# Patient Record
Sex: Female | Born: 1960 | Race: Black or African American | Hispanic: No | Marital: Married | Smoking: Never smoker
Health system: Southern US, Community
[De-identification: ages and names within clinical notes are randomized; demographics above are authoritative.]

## PROBLEM LIST (undated history)

## (undated) DIAGNOSIS — Q892 Congenital malformations of other endocrine glands: Secondary | ICD-10-CM

## (undated) DIAGNOSIS — I10 Essential (primary) hypertension: Secondary | ICD-10-CM

## (undated) DIAGNOSIS — G9050 Complex regional pain syndrome I, unspecified: Secondary | ICD-10-CM

## (undated) DIAGNOSIS — M1990 Unspecified osteoarthritis, unspecified site: Secondary | ICD-10-CM

## (undated) DIAGNOSIS — E7800 Pure hypercholesterolemia, unspecified: Secondary | ICD-10-CM

## (undated) DIAGNOSIS — J708 Respiratory conditions due to other specified external agents: Secondary | ICD-10-CM

## (undated) DIAGNOSIS — N189 Chronic kidney disease, unspecified: Secondary | ICD-10-CM

## (undated) DIAGNOSIS — K219 Gastro-esophageal reflux disease without esophagitis: Secondary | ICD-10-CM

## (undated) HISTORY — PX: KIDNEY STONE SURGERY: SHX686

## (undated) HISTORY — PX: KNEE ARTHROSCOPY: SUR90

## (undated) HISTORY — PX: COMBINED HYSTEROSCOPY DIAGNOSTIC / D&C: SUR297

## (undated) HISTORY — PX: WRIST ARTHROSCOPY W/ TRIANGULAR FIBROCARTILAGE REPAIR: SHX2670

## (undated) HISTORY — PX: DIAGNOSTIC LAPAROSCOPY: SUR761

## (undated) HISTORY — PX: ABDOMINAL HYSTERECTOMY: SHX81

---

## 1997-11-18 ENCOUNTER — Observation Stay (HOSPITAL_COMMUNITY): Admission: AD | Admit: 1997-11-18 | Discharge: 1997-11-19 | Payer: Self-pay | Admitting: Obstetrics and Gynecology

## 1997-11-28 ENCOUNTER — Inpatient Hospital Stay (HOSPITAL_COMMUNITY): Admission: AD | Admit: 1997-11-28 | Discharge: 1997-11-30 | Payer: Self-pay | Admitting: Obstetrics and Gynecology

## 1997-12-04 ENCOUNTER — Inpatient Hospital Stay (HOSPITAL_COMMUNITY): Admission: AD | Admit: 1997-12-04 | Discharge: 1997-12-04 | Payer: Self-pay | Admitting: Obstetrics and Gynecology

## 1997-12-05 ENCOUNTER — Encounter (HOSPITAL_COMMUNITY): Admission: RE | Admit: 1997-12-05 | Discharge: 1998-03-02 | Payer: Self-pay | Admitting: Obstetrics and Gynecology

## 1997-12-18 ENCOUNTER — Inpatient Hospital Stay (HOSPITAL_COMMUNITY): Admission: AD | Admit: 1997-12-18 | Discharge: 1997-12-18 | Payer: Self-pay | Admitting: Obstetrics and Gynecology

## 1998-01-12 ENCOUNTER — Inpatient Hospital Stay (HOSPITAL_COMMUNITY): Admission: AD | Admit: 1998-01-12 | Discharge: 1998-01-12 | Payer: Self-pay | Admitting: Gynecology

## 1998-01-20 ENCOUNTER — Inpatient Hospital Stay (HOSPITAL_COMMUNITY): Admission: AD | Admit: 1998-01-20 | Discharge: 1998-01-20 | Payer: Self-pay | Admitting: Obstetrics and Gynecology

## 1998-03-01 ENCOUNTER — Inpatient Hospital Stay (HOSPITAL_COMMUNITY): Admission: AD | Admit: 1998-03-01 | Discharge: 1998-03-04 | Payer: Self-pay | Admitting: Gynecology

## 1998-03-07 ENCOUNTER — Encounter (HOSPITAL_COMMUNITY): Admission: RE | Admit: 1998-03-07 | Discharge: 1998-06-05 | Payer: Self-pay | Admitting: Obstetrics and Gynecology

## 1999-04-18 ENCOUNTER — Other Ambulatory Visit: Admission: RE | Admit: 1999-04-18 | Discharge: 1999-04-18 | Payer: Self-pay | Admitting: Obstetrics and Gynecology

## 1999-11-06 ENCOUNTER — Ambulatory Visit (HOSPITAL_COMMUNITY): Admission: RE | Admit: 1999-11-06 | Discharge: 1999-11-06 | Payer: Self-pay | Admitting: Obstetrics and Gynecology

## 1999-11-06 ENCOUNTER — Encounter: Payer: Self-pay | Admitting: Obstetrics and Gynecology

## 2001-02-16 ENCOUNTER — Other Ambulatory Visit: Admission: RE | Admit: 2001-02-16 | Discharge: 2001-02-16 | Payer: Self-pay | Admitting: Obstetrics and Gynecology

## 2002-01-05 ENCOUNTER — Encounter: Payer: Self-pay | Admitting: Obstetrics and Gynecology

## 2002-01-05 ENCOUNTER — Ambulatory Visit (HOSPITAL_COMMUNITY): Admission: RE | Admit: 2002-01-05 | Discharge: 2002-01-05 | Payer: Self-pay | Admitting: Obstetrics and Gynecology

## 2002-04-01 ENCOUNTER — Other Ambulatory Visit: Admission: RE | Admit: 2002-04-01 | Discharge: 2002-04-01 | Payer: Self-pay | Admitting: Obstetrics and Gynecology

## 2002-09-22 ENCOUNTER — Ambulatory Visit (HOSPITAL_COMMUNITY): Admission: RE | Admit: 2002-09-22 | Discharge: 2002-09-22 | Payer: Self-pay | Admitting: Interventional Cardiology

## 2004-01-12 ENCOUNTER — Emergency Department (HOSPITAL_COMMUNITY): Admission: EM | Admit: 2004-01-12 | Discharge: 2004-01-12 | Payer: Self-pay | Admitting: Emergency Medicine

## 2004-05-04 ENCOUNTER — Ambulatory Visit (HOSPITAL_COMMUNITY): Admission: RE | Admit: 2004-05-04 | Discharge: 2004-05-04 | Payer: Self-pay | Admitting: Internal Medicine

## 2005-08-13 ENCOUNTER — Ambulatory Visit (HOSPITAL_COMMUNITY): Admission: RE | Admit: 2005-08-13 | Discharge: 2005-08-13 | Payer: Self-pay | Admitting: Internal Medicine

## 2005-10-14 ENCOUNTER — Emergency Department (HOSPITAL_COMMUNITY): Admission: EM | Admit: 2005-10-14 | Discharge: 2005-10-15 | Payer: Self-pay | Admitting: Emergency Medicine

## 2005-11-25 ENCOUNTER — Ambulatory Visit (HOSPITAL_COMMUNITY): Admission: RE | Admit: 2005-11-25 | Discharge: 2005-11-25 | Payer: Self-pay | Admitting: Obstetrics and Gynecology

## 2005-12-19 ENCOUNTER — Encounter: Admission: RE | Admit: 2005-12-19 | Discharge: 2005-12-19 | Payer: Self-pay | Admitting: Obstetrics and Gynecology

## 2006-02-06 ENCOUNTER — Ambulatory Visit (HOSPITAL_COMMUNITY): Admission: RE | Admit: 2006-02-06 | Discharge: 2006-02-06 | Payer: Self-pay | Admitting: Obstetrics and Gynecology

## 2006-02-06 ENCOUNTER — Encounter (INDEPENDENT_AMBULATORY_CARE_PROVIDER_SITE_OTHER): Payer: Self-pay | Admitting: *Deleted

## 2006-05-06 ENCOUNTER — Encounter: Admission: RE | Admit: 2006-05-06 | Discharge: 2006-05-06 | Payer: Self-pay | Admitting: Internal Medicine

## 2006-05-28 ENCOUNTER — Inpatient Hospital Stay (HOSPITAL_COMMUNITY): Admission: AD | Admit: 2006-05-28 | Discharge: 2006-06-03 | Payer: Self-pay

## 2006-08-18 ENCOUNTER — Ambulatory Visit (HOSPITAL_COMMUNITY): Admission: RE | Admit: 2006-08-18 | Discharge: 2006-08-19 | Payer: Self-pay | Admitting: Obstetrics and Gynecology

## 2006-08-18 ENCOUNTER — Encounter (INDEPENDENT_AMBULATORY_CARE_PROVIDER_SITE_OTHER): Payer: Self-pay | Admitting: Obstetrics and Gynecology

## 2006-08-31 ENCOUNTER — Inpatient Hospital Stay (HOSPITAL_COMMUNITY): Admission: AD | Admit: 2006-08-31 | Discharge: 2006-08-31 | Payer: Self-pay | Admitting: Obstetrics and Gynecology

## 2006-09-02 ENCOUNTER — Ambulatory Visit: Payer: Self-pay | Admitting: Internal Medicine

## 2006-09-10 ENCOUNTER — Ambulatory Visit (HOSPITAL_COMMUNITY): Admission: RE | Admit: 2006-09-10 | Discharge: 2006-09-10 | Payer: Self-pay | Admitting: Internal Medicine

## 2007-02-13 ENCOUNTER — Ambulatory Visit (HOSPITAL_COMMUNITY): Admission: RE | Admit: 2007-02-13 | Discharge: 2007-02-13 | Payer: Self-pay | Admitting: Internal Medicine

## 2007-04-07 ENCOUNTER — Encounter: Admission: RE | Admit: 2007-04-07 | Discharge: 2007-04-07 | Payer: Self-pay | Admitting: Internal Medicine

## 2007-05-04 ENCOUNTER — Encounter: Admission: RE | Admit: 2007-05-04 | Discharge: 2007-05-04 | Payer: Self-pay | Admitting: Allergy and Immunology

## 2007-05-08 ENCOUNTER — Encounter: Admission: RE | Admit: 2007-05-08 | Discharge: 2007-05-08 | Payer: Self-pay | Admitting: Allergy and Immunology

## 2007-11-30 ENCOUNTER — Encounter: Admission: RE | Admit: 2007-11-30 | Discharge: 2007-11-30 | Payer: Self-pay | Admitting: Internal Medicine

## 2008-03-14 IMAGING — CT CT HEAD W/O CM
1 series · 16 of 26 positions shown, 20 images · IV contrast (agent unspecified)
Comparison: none

CLINICAL DATA: Syncope, headache.  
 HEAD CT WITHOUT CONTRAST:
TECHNIQUE: Contiguous axial images were obtained from the base of the skull through the vertex according to standard protocol without contrast.

[Series 2: brain · axial · 0.47mm/px · z∈[+154,+270]mm · 16 of 26 slices shown, 20 images]
[im 2/26  brain]
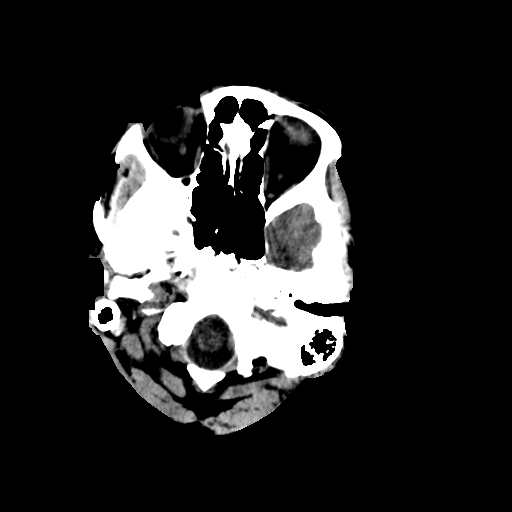
[im 2/26  bone]
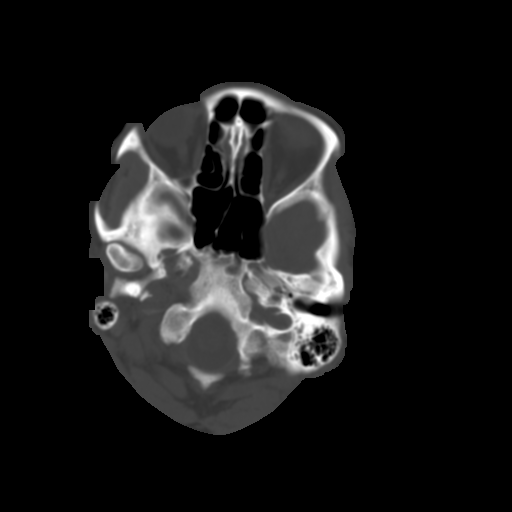
[im 4/26  brain]
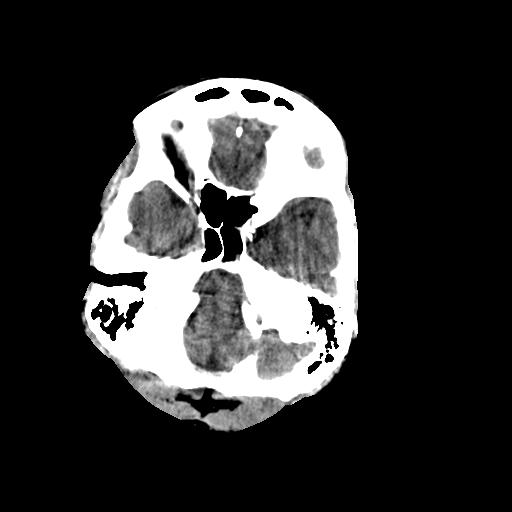
[im 5/26  brain]
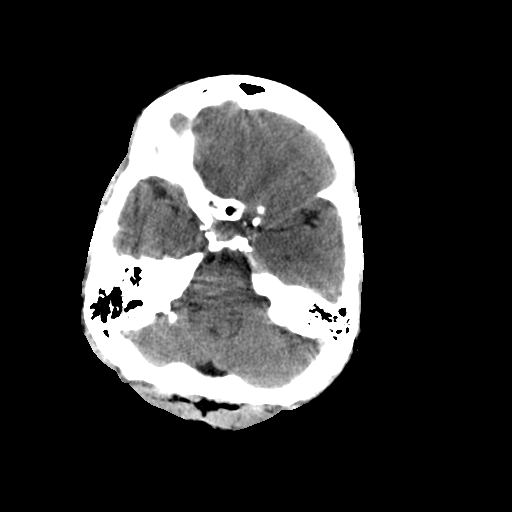
[im 7/26  brain]
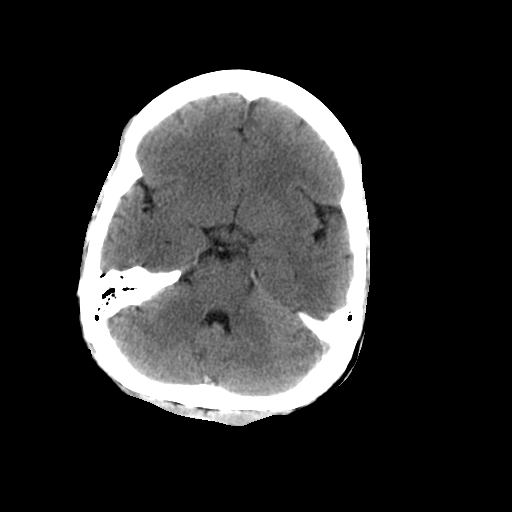
[im 8/26  brain]
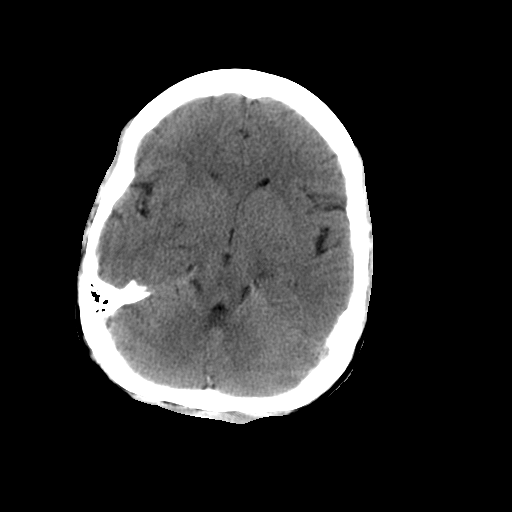
[im 8/26  bone]
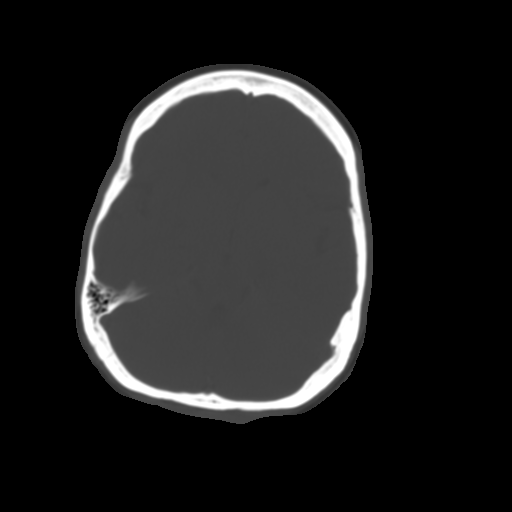
[im 10/26  brain]
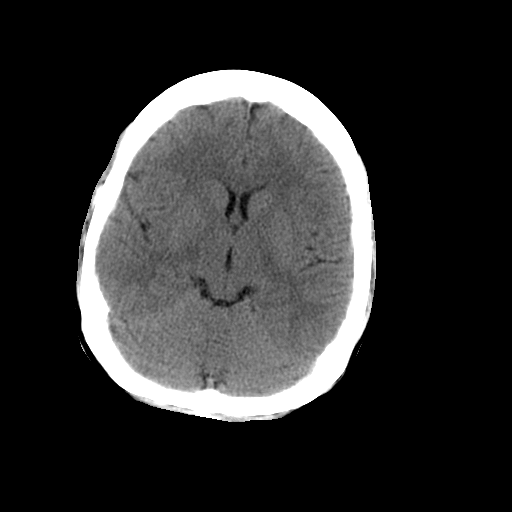
[im 11/26  brain]
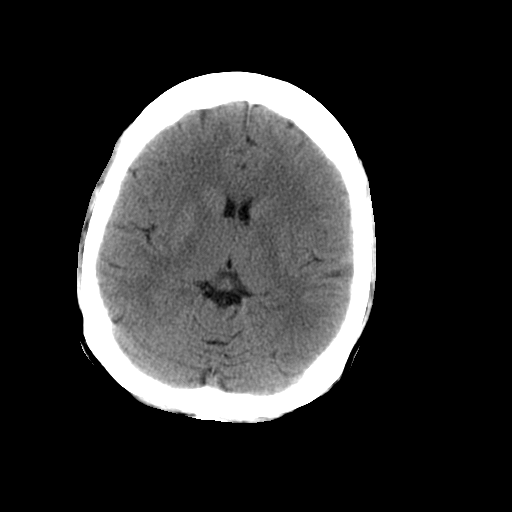
[im 13/26  brain]
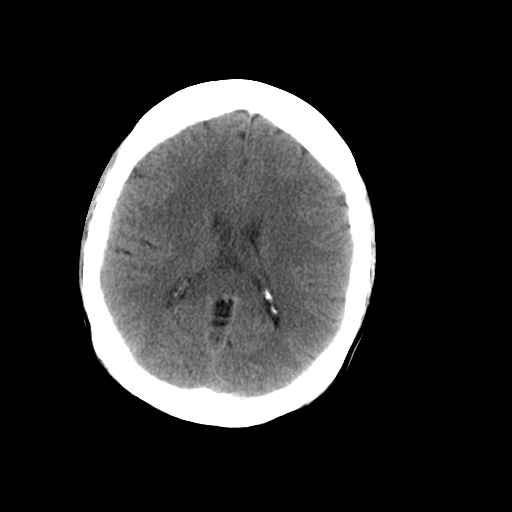
[im 14/26  brain]
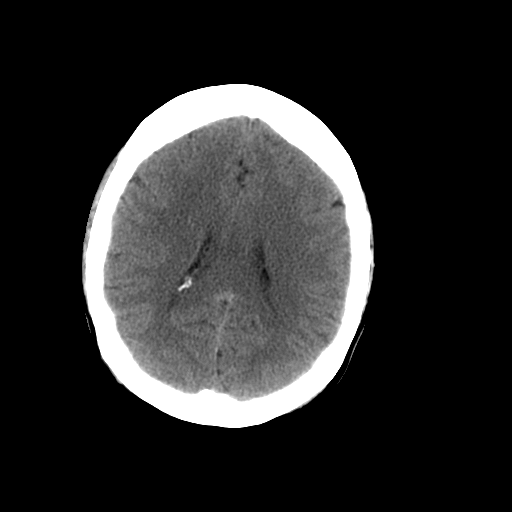
[im 14/26  bone]
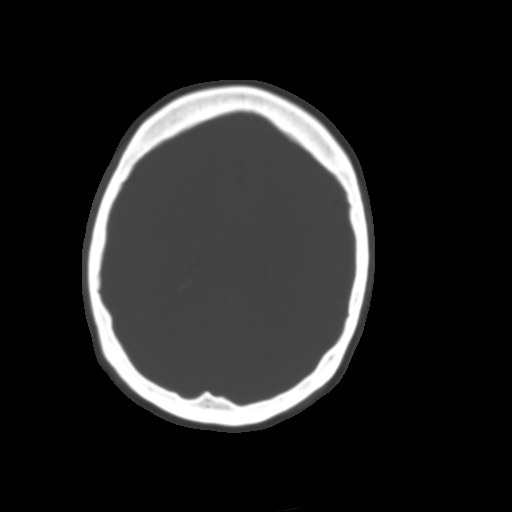
[im 16/26  brain]
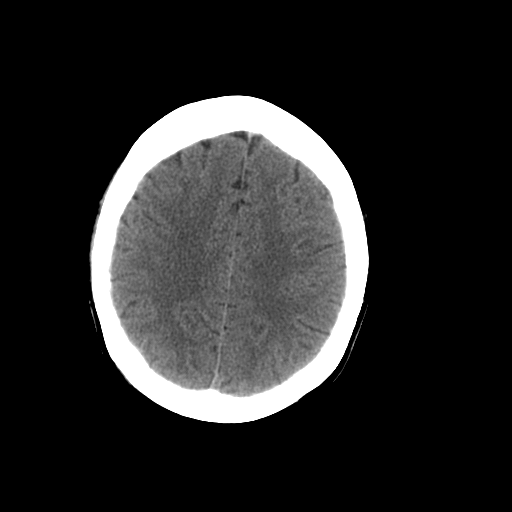
[im 17/26  brain]
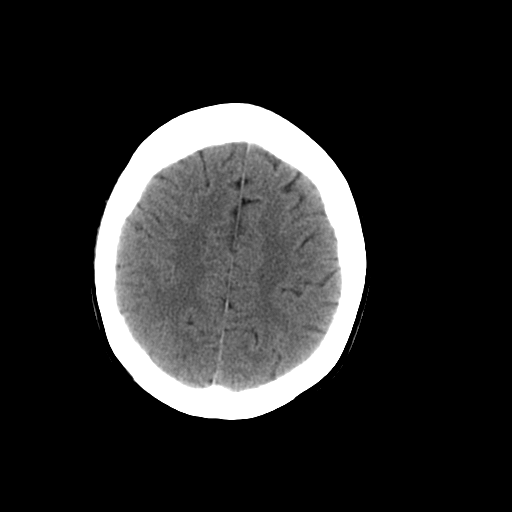
[im 19/26  brain]
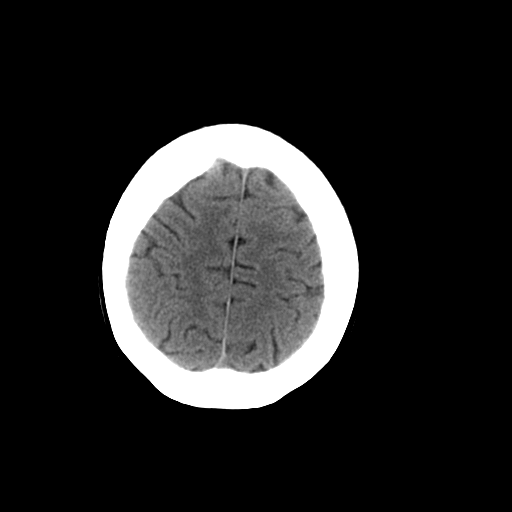
[im 20/26  brain]
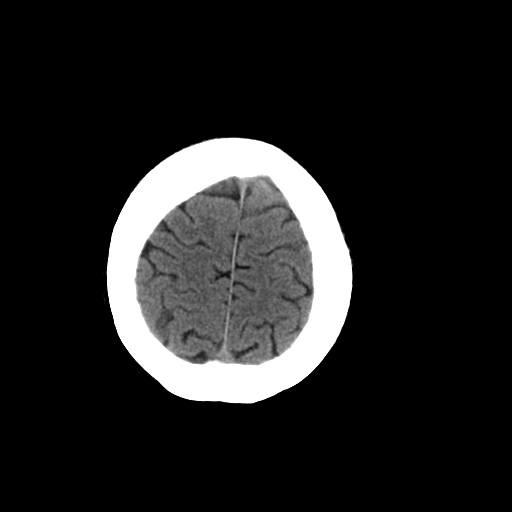
[im 20/26  bone]
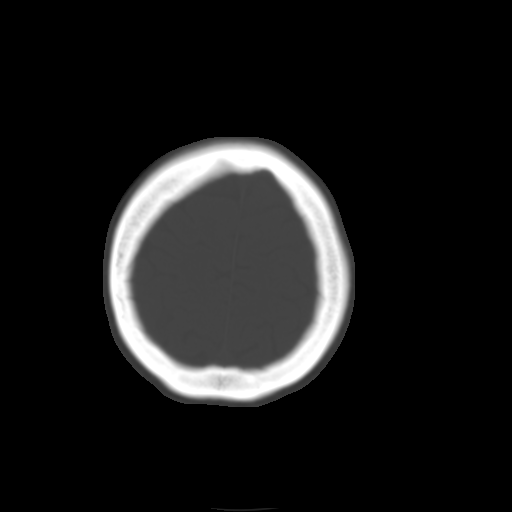
[im 22/26  brain]
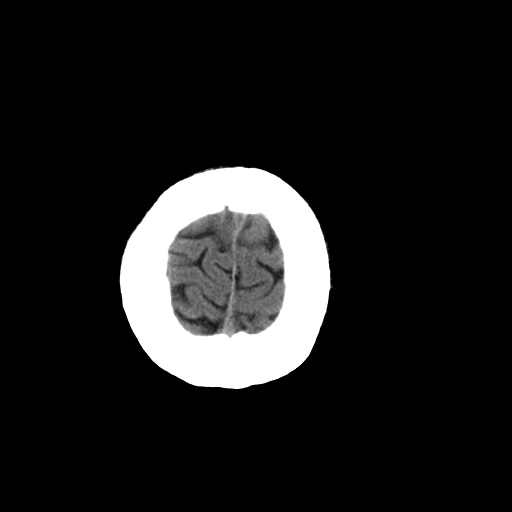
[im 23/26  brain]
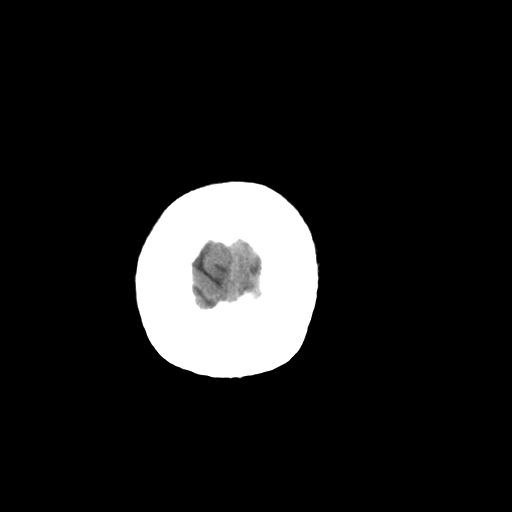
[im 25/26  brain]
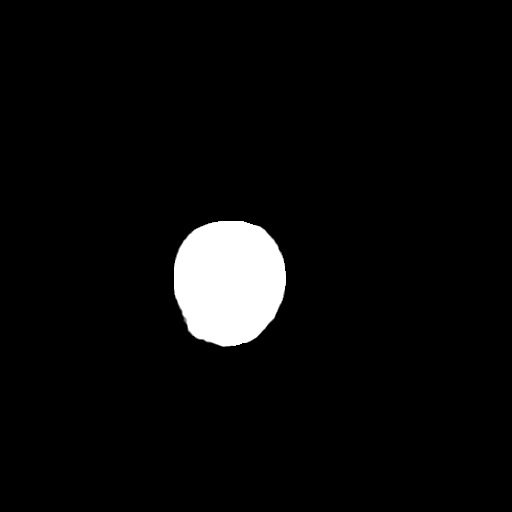

[16 of 26 positions shown; findings below may reference images not displayed]

FINDINGS: There is no evidence of intracranial hemorrhage, brain edema, acute infarct, mass lesion, or mass effect.  No other intra-axial abnormalities are seen, and the ventricles are within normal limits.  No abnormal extra-axial fluid collections or masses are identified.  No skull abnormalities are noted.
IMPRESSION: Negative non-contrast head CT.

## 2008-04-25 IMAGING — US US PELVIS COMPLETE MODIFY
1 series · 14 of 25 positions shown · non-contrast
Comparison: none

CLINICAL DATA: Pelvic pain; evaluate for fibroids.
 TRANSABDOMINAL AND TRANSVAGINAL PELVIC ULTRASOUND:
TECHNIQUE: Both transabdominal and transvaginal ultrasound examinations of the pelvis were performed including evaluation of the uterus, ovaries, adnexal regions, and pelvic cul-de-sac.

[Series 1: us pelvis complete modify · 0.41mm/px · 14 of 57 slices shown]
[im 1/57]
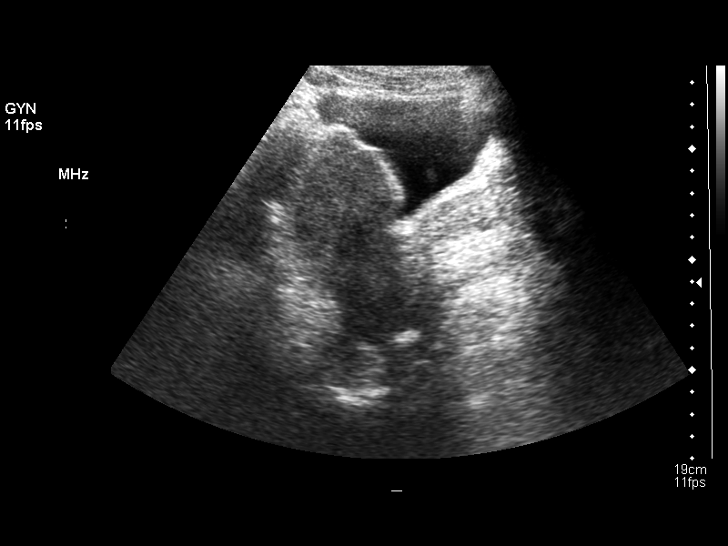
[im 5/57]
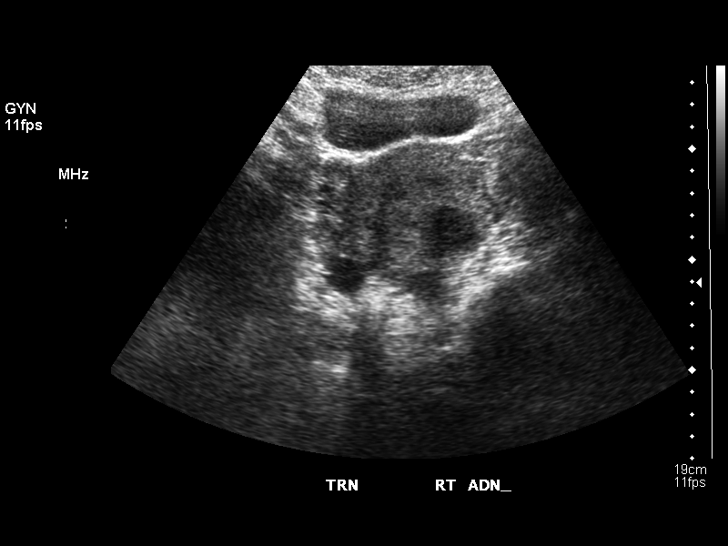
[im 10/57]
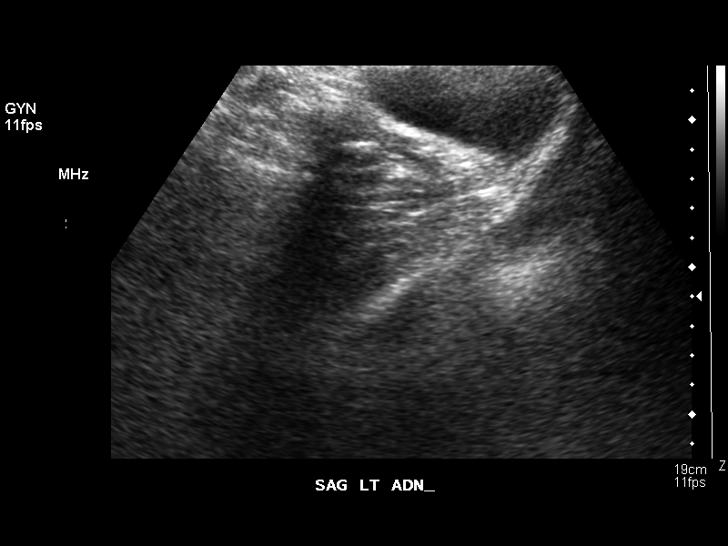
[im 15/57]
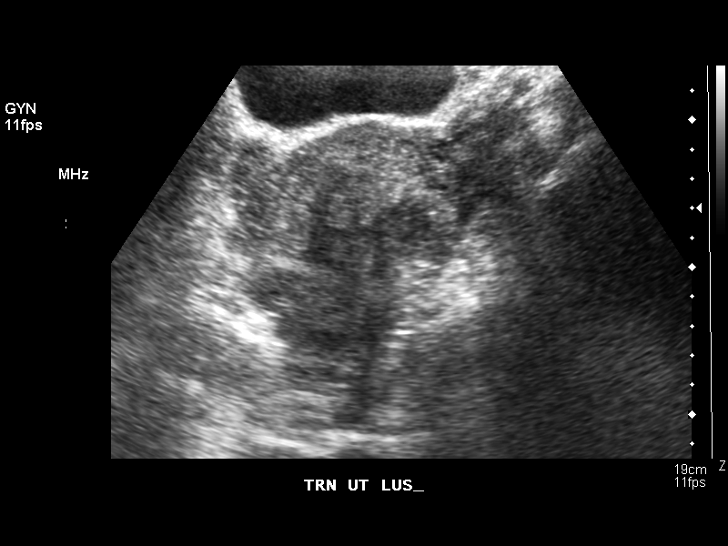
[im 19/57]
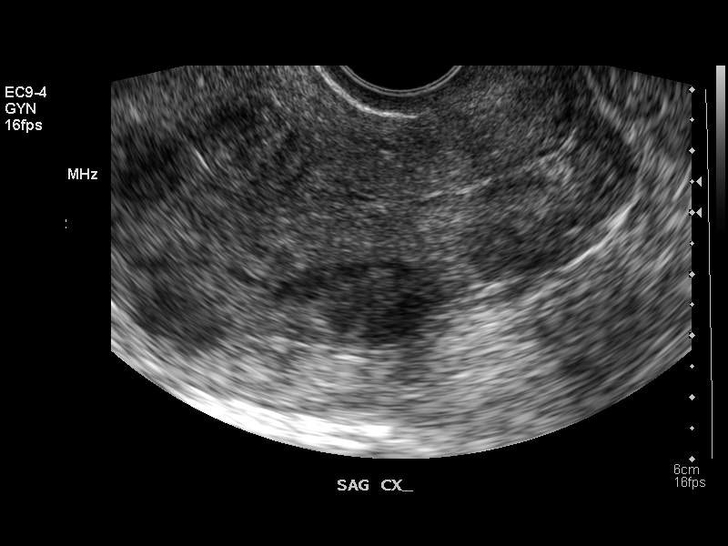
[im 22/57]
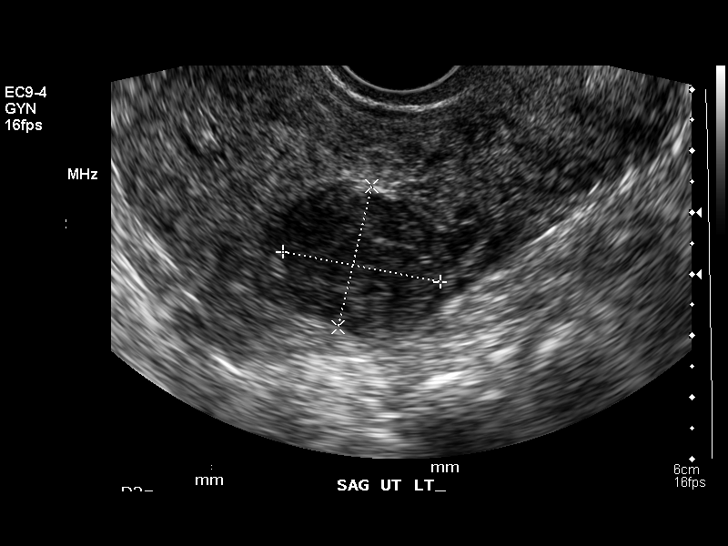
[im 26/57]
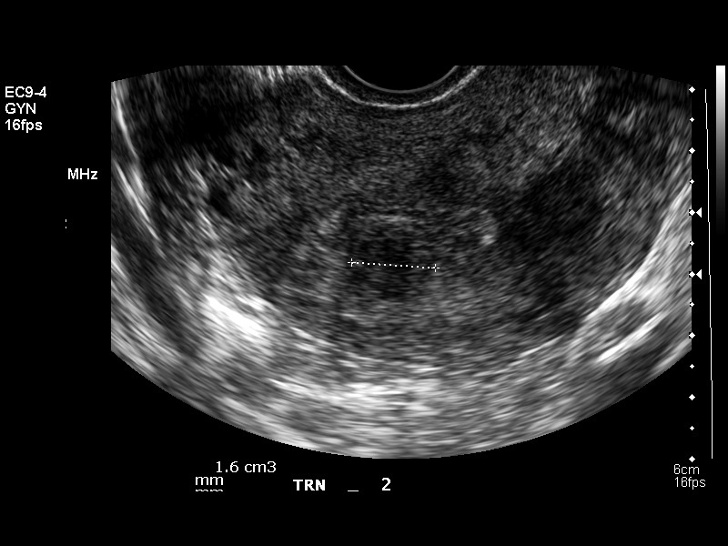
[im 31/57]
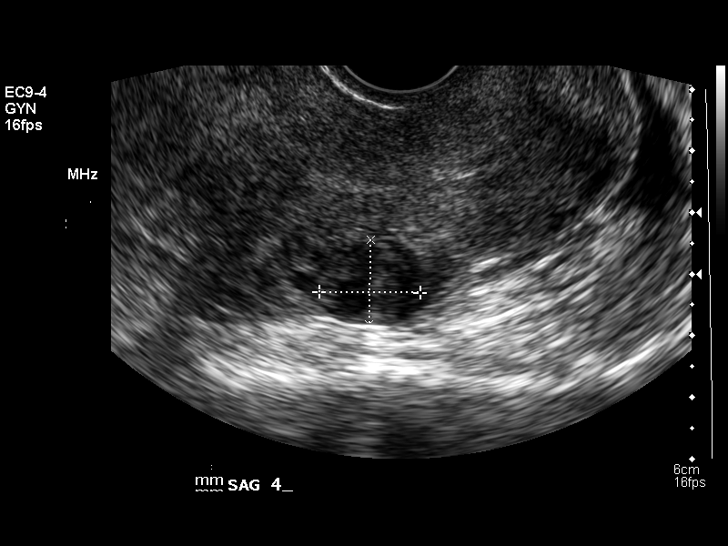
[im 36/57]
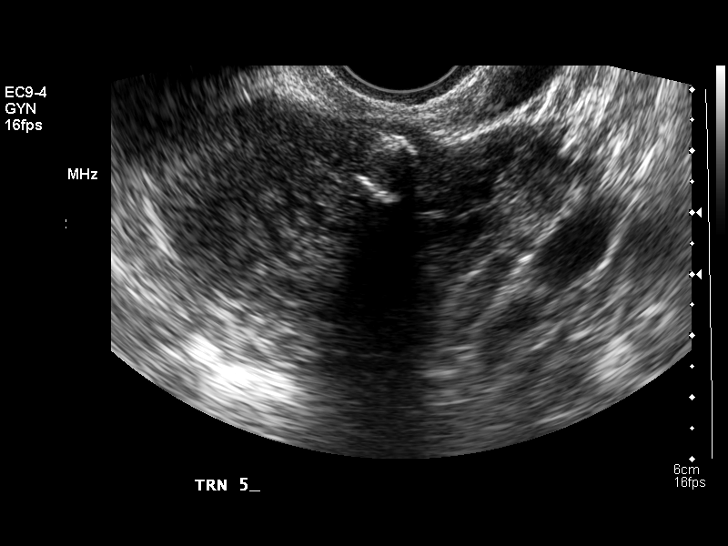
[im 38/57]
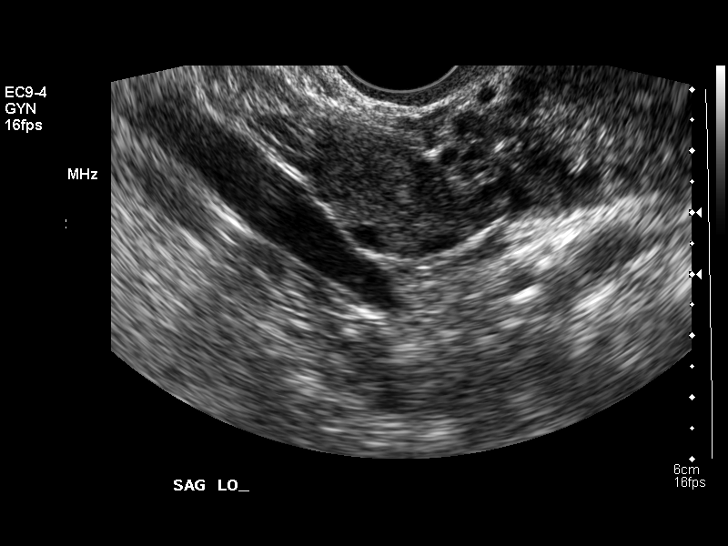
[im 43/57]
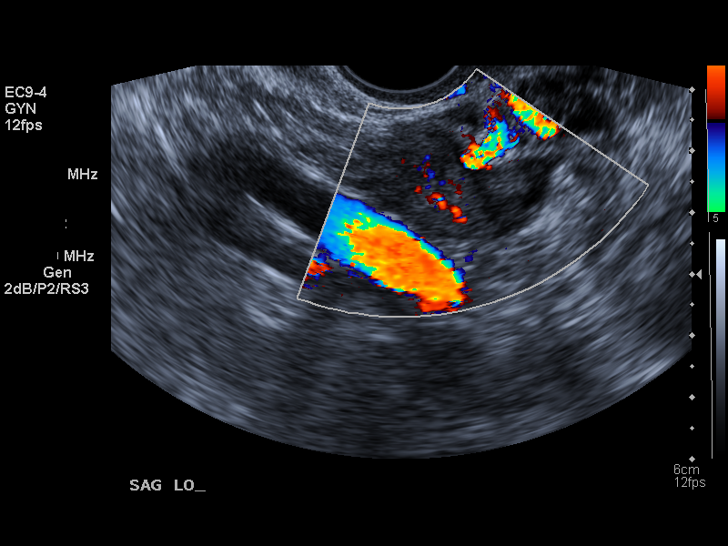
[im 47/57]
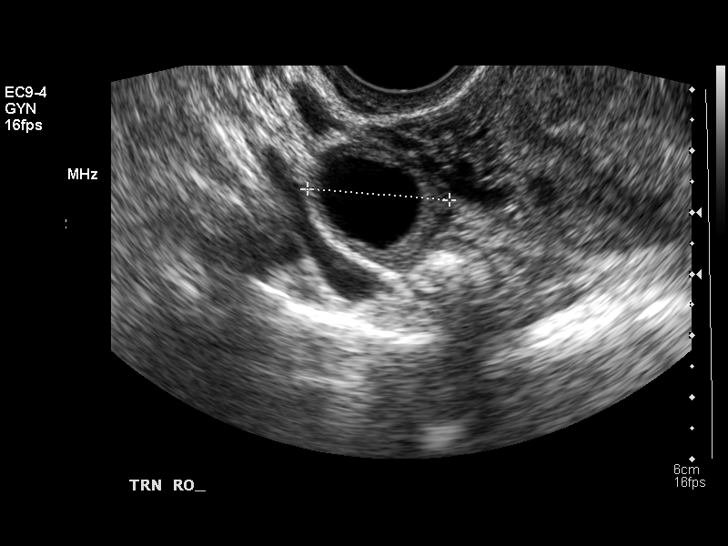
[im 52/57]
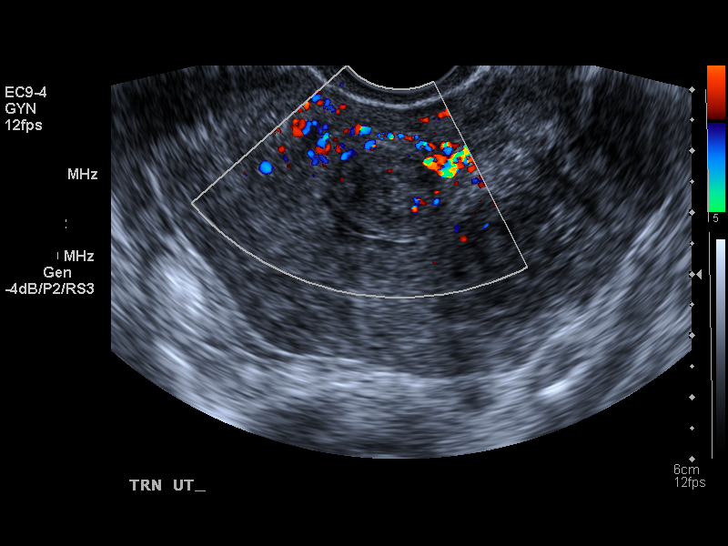
[im 57/57]
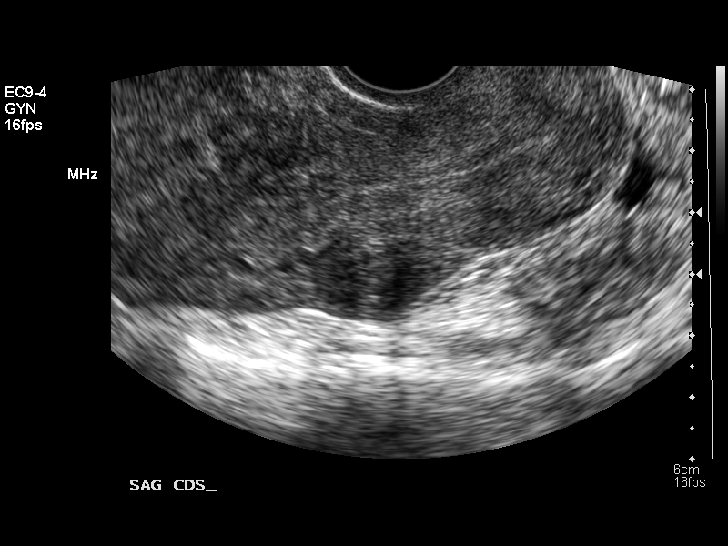

[14 of 25 positions shown; findings below may reference images not displayed]

FINDINGS: The uterus is slightly enlarged, but lobular. The uterine dimensions are 9.7 x 4.7 x 6.8 cm.  Numerous fibroids are present.  The largest are located in the posterior myometrium at the mid-segment measuring 2.6 cm and to the left of midline at the mid-segment measuring 2.2 cm.  The endometrium is fairly indistinct which may represent an element of adenomyosis.  The endometrial stripe is not thickened, however, measuring 7 mm in width.  
 Both ovaries have a normal size, shape and appearance.  The right ovary measures 3.7 x 1.6 x 2.3 cm, and the left ovary measures 3.0 x 1.6 x 1.4 cm.  Trace free pelvic fluid is seen.
IMPRESSION: Fibroid uterus.  The overall heterogeneity of the myometrial echotexture and indistinctness of the junctional zone also raises the question of adenomyosis.

## 2008-05-16 ENCOUNTER — Encounter: Admission: RE | Admit: 2008-05-16 | Discharge: 2008-05-16 | Payer: Self-pay | Admitting: Internal Medicine

## 2008-12-02 ENCOUNTER — Encounter: Admission: RE | Admit: 2008-12-02 | Discharge: 2008-12-02 | Payer: Self-pay | Admitting: Internal Medicine

## 2009-01-05 ENCOUNTER — Encounter: Admission: RE | Admit: 2009-01-05 | Discharge: 2009-01-05 | Payer: Self-pay | Admitting: Internal Medicine

## 2009-06-20 ENCOUNTER — Emergency Department (HOSPITAL_COMMUNITY): Admission: EM | Admit: 2009-06-20 | Discharge: 2009-06-20 | Payer: Self-pay | Admitting: Emergency Medicine

## 2009-10-06 IMAGING — CT CT PARANASAL SINUSES LIMITED
1 series · 16 of 20 positions shown, 20 images · IV contrast (agent unspecified)
Comparison: none

CLINICAL DATA: Shortness of breath, sinusitis, cough. 
 CT SINUS LIMITED WITHOUT CONTRAST:
TECHNIQUE: Limited coronal CT images were obtained through the paranasal sinuses without intravenous contrast.

[Series 2: limited sinus prone · axial · 0.33mm/px · z∈[+54,+122]mm · 16 of 20 slices shown, 20 images]
[im 2/20  brain]
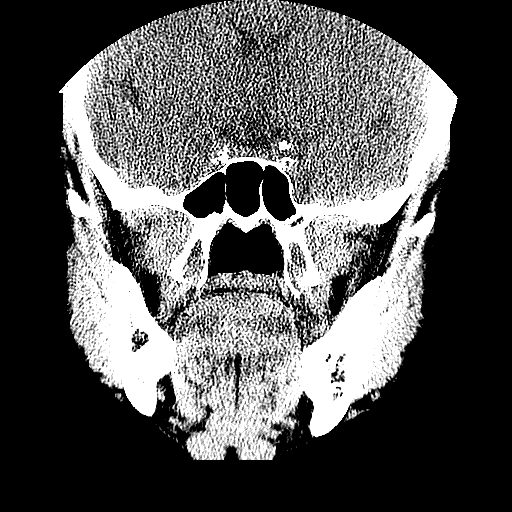
[im 2/20  bone]
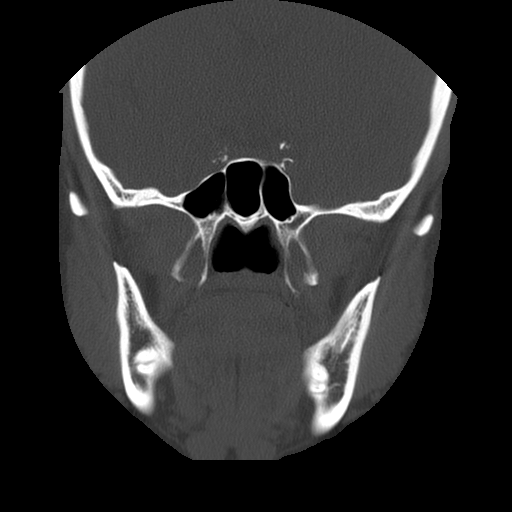
[im 3/20  bone]
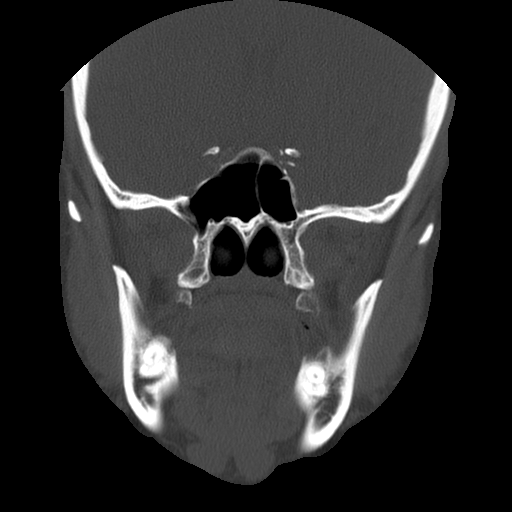
[im 4/20  bone]
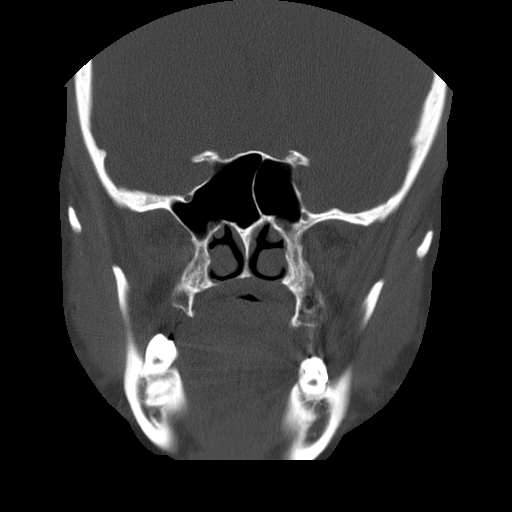
[im 5/20  bone]
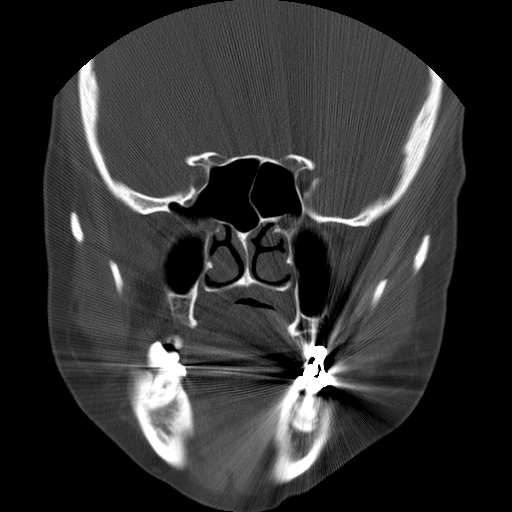
[im 7/20  brain]
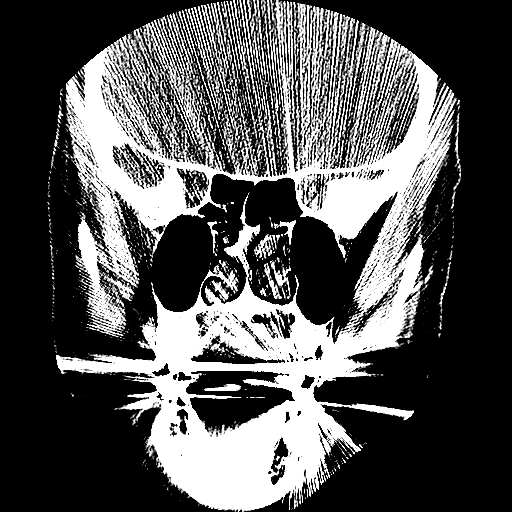
[im 7/20  bone]
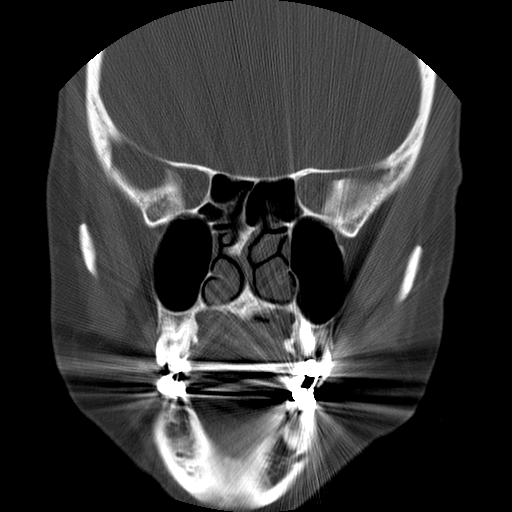
[im 8/20  bone]
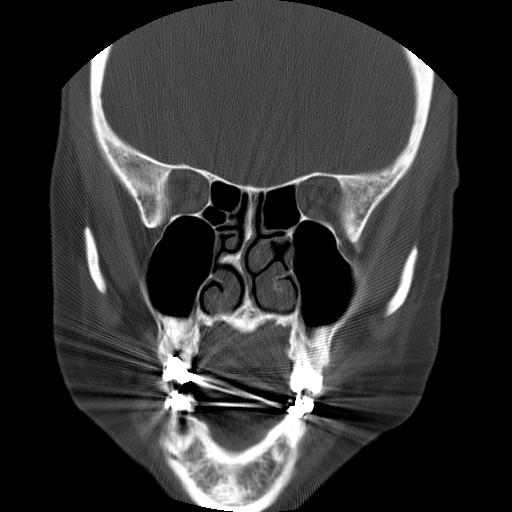
[im 9/20  bone]
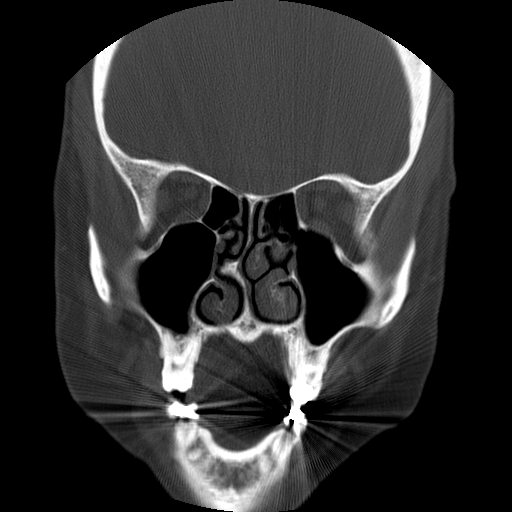
[im 10/20  bone]
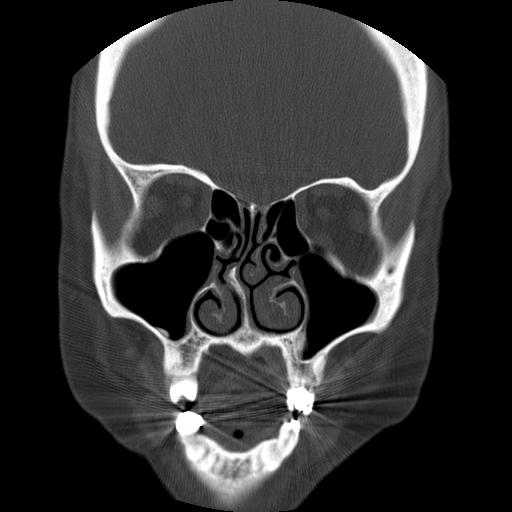
[im 11/20  brain]
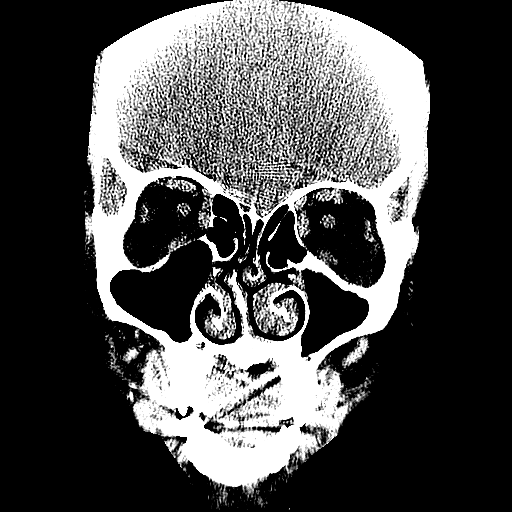
[im 11/20  bone]
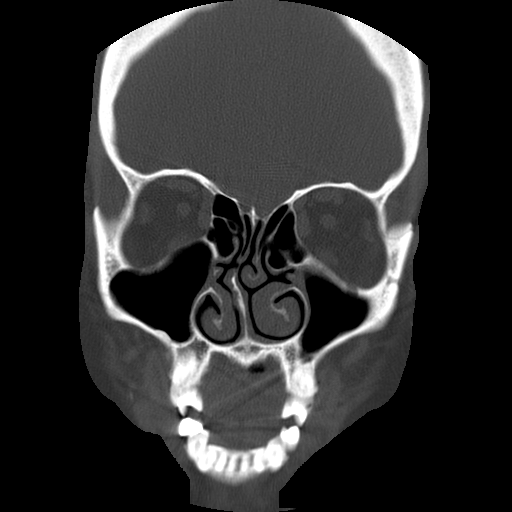
[im 12/20  bone]
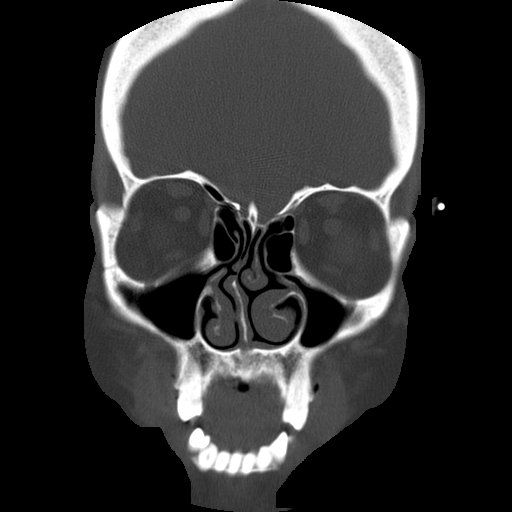
[im 13/20  bone]
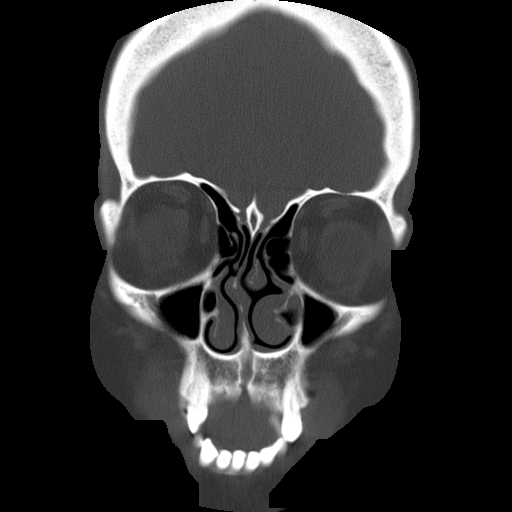
[im 14/20  bone]
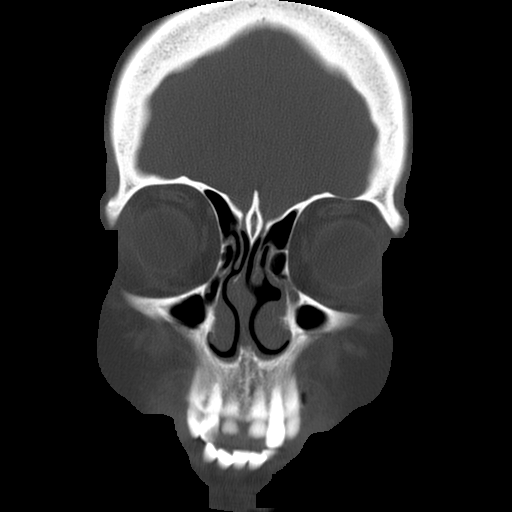
[im 16/20  brain]
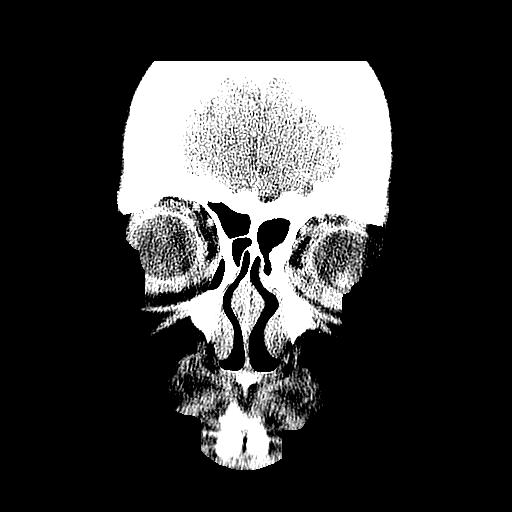
[im 16/20  bone]
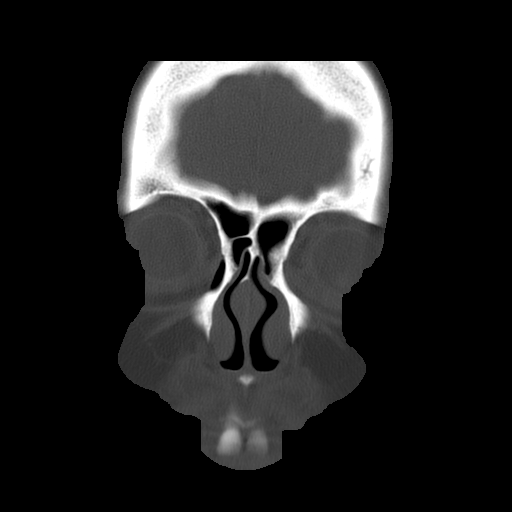
[im 17/20  bone]
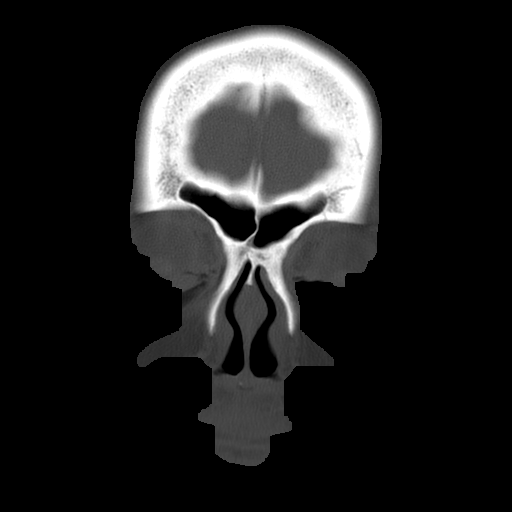
[im 18/20  bone]
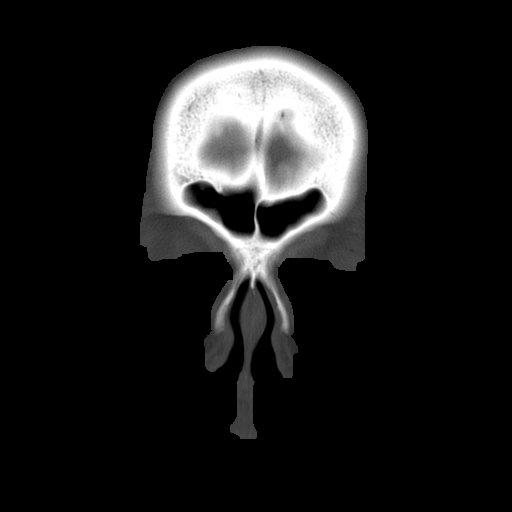
[im 19/20  bone]
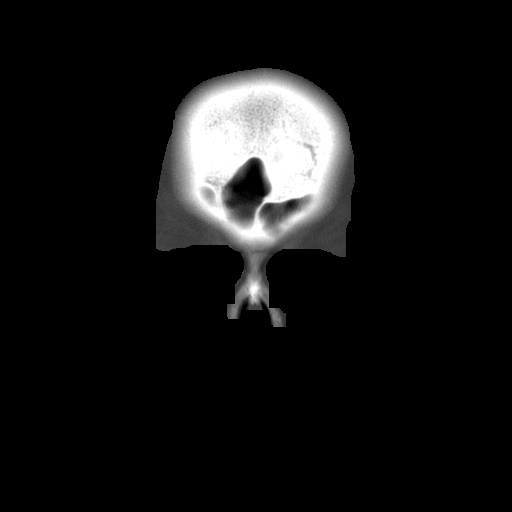

[16 of 20 positions shown; findings below may reference images not displayed]

FINDINGS: The frontal, ethmoid, maxillary, and sphenoid sinuses are all clear.  There is mild nasal septal deviation towards the left.
IMPRESSION: Normal examination.

## 2010-05-14 ENCOUNTER — Other Ambulatory Visit (HOSPITAL_COMMUNITY): Payer: Self-pay | Admitting: Internal Medicine

## 2010-05-14 DIAGNOSIS — Z1231 Encounter for screening mammogram for malignant neoplasm of breast: Secondary | ICD-10-CM

## 2010-05-22 ENCOUNTER — Ambulatory Visit (HOSPITAL_COMMUNITY): Payer: BC Managed Care – PPO

## 2010-05-23 ENCOUNTER — Ambulatory Visit (HOSPITAL_COMMUNITY)
Admission: RE | Admit: 2010-05-23 | Discharge: 2010-05-23 | Disposition: A | Discharge status: Home / Self Care | Payer: BC Managed Care – PPO | Source: Ambulatory Visit | Attending: Internal Medicine | Admitting: Internal Medicine

## 2010-05-23 DIAGNOSIS — Z1231 Encounter for screening mammogram for malignant neoplasm of breast: Secondary | ICD-10-CM | POA: Insufficient documentation

## 2010-07-10 NOTE — Assessment & Plan Note (Signed)
[facility]                             PULMONARY OFFICE NOTE   [name], [name]                       MRN:          [id number]  DATE:09/02/2006                            DOB:          03/03/1960    REFERRING PHYSICIAN:  Merlene Laughter. Renae Gloss, M.D.   PROBLEM:  A 50 year old woman seen at the kind request of Dr. Renae Gloss in  pulmonary consultation for a second opinion about her allergy and asthma  status.   HISTORY:  She had had a bronchitis type illness a year ago, while in  [place], and has a past history of seasonal rhinitis.  She was skin  tested on evaluation by Dr. Barnetta Chapel, who told her she had asthma, and  sent back to Dr. Renae Gloss for followup.  There was concern that her  bronchitis episode resulted from exposure to moldy environment at a  motel, where she stayed in [place].  She had had a similar problem at  another motel in [place].  After these, she was well until February of  2008, when she began coughing and felt short of breath.  She was seen at  her primary office, diagnosed with a viral bronchitis.  When symptoms  persisted for several weeks, she was treated with antibiotics and  prednisone.  Advair was tried in March.  When that was not helpful, she  was hospitalized for six days and then sent home with a home nebulizer  machine.  She had felt well until around Mothers Day, when she was again  diagnosed with bronchitis in May of 2008.  Metered inhalers and  breathing treatments do provide symptomatic relief.  She considers the  main trigger to be mold, but has not noticed a specific pattern  otherwise.  She considers the classroom and school where she had been  working to have considerable mold issues.   MEDICATIONS:  1. Atenolol 50 mg.  2. Triamterene/HCTZ 37.5/25.  3. Clarinex 5 mg.  4. Singulair 10 mg.  5. Nasonex.  6. Astelin.  7. Advair 250/50.  8. Tricor 145 mg.  9. Vytorin 10/80.  10.Albuterol metered inhaler.  11.Home nebulizer with albuterol.  12.Tessalon Perles.  13.Motrin.  14.Darvocet.   DRUG INTOLERANCE:  MACROBID, TYLOX and VICODIN.  Allergy to MINOCIN.  No  intolerance to latex, contrast dye or aspirin.   REVIEW OF SYSTEMS:  Dyspnea with activity and sometimes at rest,  productive and nonproductive cough, occasional palpitation, acid  indigestion, occasional stomach pains, sore throat, headaches, and nasal  congestion, sneezing.  She noted reflux symptoms only while on  prednisone and she took Zantac then.  Currently feels mildly tight in  the chest and notices shortness of breath if she is out in humid  weather.   PAST HISTORY:  Hypertension, asthma, elevated cholesterol, allergic  rhinitis.  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  Apparently she was  not treated.  Hysterectomy in June of 2008, without complications.  Previous arthroscopy, right knee.  Lithotripsy, left kidney.  LEEP,  laparoscopy and D and C.   SOCIAL HISTORY:  Nonsmoker, occasional social alcohol.  Marital  separation.  Lives with her daughter.  Works as a Runner, broadcasting/film/video.   FAMILY HISTORY:  Parents, brother and daughter have allergies.  Father  with heart disease.   ENVIRONMENTAL:  Lives in a house built in 1989.  It has a crawl space,  central air conditioning, gas heat, no pets.  The classroom where she  worked from December until the end of May she says was associated with a  burning feeling in her lungs and symptomatic flare every time she went  back there.  In the coming school year, she will be working in a  different school.  She says she was symptomatically better at home.   OBJECTIVE:  Weight 136 pounds, BP 116/72, pulse 87, room air saturation  100%.  This is a well-developed, well-nourished woman, not in obvious distress  now.  SKIN:  Without rash.  ADENOPATHY:  Not found.  HEENT:  Periorbital edema.  Conjunctivae not injected.  Nasal airway not  obstructed.  Pharynx clear with palate spacing  3-4/4.  No stridor or  hoarseness.  No neck vein distention.  CHEST:  Quiet, clear, unlabored breathing.  Heart sounds regular, without murmur or gallop.  EXTREMITIES:  No edema, cyanosis or clubbing.   SPIROMETRY:  Patient reports that spirometry was normal at Dr.  Zelphia Cairo office.   IMPRESSION:  1. Asthma or asthmatic bronchitis.  2. Allergic rhinitis.  Skin test positive in August of 2007 by Dr.      Barnetta Chapel.  Dr. Barnetta Chapel had sent her back to Dr. Renae Gloss, but she has      not thought to return there for followup until recently, but she      now has a followup appointment.  The key issue is persistent      symptoms over multiple weeks and also her impression that mold,      not very specifically defined, may be the source of her problem.   PLAN:  1. I would like to see her taken off her beta blocker drug.  2. She has permission to try tapering off Advair to see if it was      really making any difference.  Since she is not wheezing now and      the effectiveness of Advair is not definite for her, I gave her      permission to drop down to once a day for several weeks.  If she      does not flare, then she may try stopping to see if it is really      making a difference.  3. Recommend formal pulmonary function tests, including measured lung      volumes and response to bronchodilator.  This is being scheduled at      [facility].  4. Since she has  a followup appointment with Dr. Barnetta Chapel, I have      offered to see her back p.r.n. and would be happy to help, if      needed.   I appreciate the chance to meet her.     Clinton D. Maple Hudson, MD, Tonny Bollman, FACP  Electronically Signed    CDY/MedQ  DD: 09/18/2006  DT: 09/19/2006  Job #: [id number]

## 2010-07-10 NOTE — Op Note (Signed)
[name], [name]                ACCOUNT NO.:  [id number]   MEDICAL RECORD NO.:  [id number]          PATIENT TYPE:  OIB   LOCATION:  9316                          FACILITY:  [facility]   PHYSICIAN:  Maxie Better, M.D.DATE OF BIRTH:  1960-03-14   DATE OF PROCEDURE:  08/18/2006  DATE OF DISCHARGE:                               OPERATIVE REPORT   PREOPERATIVE DIAGNOSIS:  Menorrhagia, pelvic pain, fibroid uterus.   PROCEDURES:  Laparoscopic-assisted vaginal hysterectomy.   POSTOPERATIVE DIAGNOSIS:  Menorrhagia, pelvic pain, right simple ovarian  cyst, fibroid uterus.   ANESTHESIA:  General.   SURGEON:  Maxie Better, M.D.   ASSISTANT:  Genia Del, M.D.   PROCEDURE:  Under adequate general anesthesia the patient is placed in  the dorsal lithotomy position.  She was sterilely prepped and draped in  usual fashion.  An indwelling catheter was sterilely placed. Bivalve  speculum was placed in the vagina.  Single-tooth tenaculum placed on the  anterior lip of the cervix and acorn cannula was introduced into the  cervical os and attached to tenaculum for manipulation of the uterus.  Bivalve speculum then removed.  Attention was then turned to the abdomen  where the patient had a previous vertical skin incision.  Quarter  percent Marcaine was injected in the infraumbilical and a small  infraumbilical incision in a vertical fashion was then made.  Veress  needle was introduced and tested with saline.  Good placement noted.  Carbon dioxide was insufflated.  Opening pressure of 4 was noted.  3  liters of CO2 was insufflated.  Veress needle was then removed.  A  disposable 10 mm trocar was introduced without difficulty. Two smaller 5  mm incisions were made in the right and left lower quadrant and 5 mm  ports were placed under direct visualization.  Inspection was notable  for fibroid uterus, right ovary with a simple cyst and a left ovary that  was otherwise normal.  No  endometriosis was noted.  The round ligament  was then bilaterally cauterized and then cut. The utero-ovarian  ligaments was bilaterally cauterized and cut until the level of the  lower uterine segment was reached.  The anterior peritoneum was then  opened transversely with a combination of scissors and cauterization and  the bladder was then sharply dissected off the lower uterine segment.  Bleeding was encountered on the left side.  Both ureters were seen  peristalsing prior to the start of the case. The cavity was then  deflated, the ports remained, and attention was then turned to the  vagina.  A weighted speculum was then placed. The instruments on the  cervix were removed.  A Jacobson tenaculum placed on the anterior lip  and posterior lip of the cervix.  A dilute solution of Pitressin was  injected at the cervicovaginal junction and a circumferential incision  was then made at that junction.  The posterior cul-de-sac was then  opened without incident.  The anterior cul-de-sac was then opened as  well.  The vaginal cuff was oversewn with 0 Vicryl running lock stitch.  The uterosacrals were then  bilaterally clamped, cut and suture ligated 0  Vicryl.  The LigaSure was then utilized to cauterize and cut the  cardinal ligaments, uterine vessels until all attachments of the uterus  to the lateral wall was severed at which time the uterus was then  removed.  Good hemostasis then noted.  The McCall culdoplasty was then  performed using 0 Vicryl suture with the uterosacral tied in the  midline.  The vaginal cuff was then closed in a vertical fashion using 0  Vicryl figure single and figure-of-eight sutures. All instruments then  removed from the vagina.  Attention was then turned back to the abdomen.  The abdomen was reinsufflated and the pelvis was inspected, irrigated,  suctioned of any blood. Pedicles were inspected.  Good hemostasis noted.  Vaginal cuff was inspected.  The gas was  partially deflated to see if  there is any bleeders.  None was noted at which time the lower ports  were then removed. The abdomen was deflated.  All instruments were then  removed. The lower port incision sites were closed with Dermabond.  The  rectus fascia was identified infraumbilically and closed with 0-0 Vicryl  figure-of-eight and the skin approximated interrupted 4-0 Vicryl  subcuticular stitch.   SPECIMENS:  Uterus and cervix sent to pathology.   ESTIMATED BLOOD LOSS:  Was 200 mL.   INTRAOPERATIVE FLUID:  Was 1700 mL crystalloid.   URINE OUTPUT:  Was 100 mL clear yellow urine.  Sponge and instrument  counts x2 was correct.   COMPLICATION:  Was none.  The patient tolerated the procedure well and  was transferred to recovery in stable condition.      Maxie Better, M.D.  Electronically Signed     [place]/MEDQ  D:  08/18/2006  T:  08/18/2006  Job:  [id number]

## 2010-07-13 NOTE — Op Note (Signed)
[name], [name]                ACCOUNT NO.:  [id number]   MEDICAL RECORD NO.:  [id number]          PATIENT TYPE:  AMB   LOCATION:  SDC                           FACILITY:  [facility]   PHYSICIAN:  Maxie Better, M.D.DATE OF BIRTH:  03/13/1960   DATE OF PROCEDURE:  02/06/2006  DATE OF DISCHARGE:                               OPERATIVE REPORT   PREOPERATIVE DIAGNOSIS:  1. Chronic left lower quadrant pain.  2. Fibroid uterus.  3. Menorrhagia.   PROCEDURES:  1. Open laparoscopy.  2. Diagnostic hysteroscopy.  3. Hysteroscopic resection of submucosal fibroid.  4. Dilation and curettage.   POSTOPERATIVE DIAGNOSES:  1. Left lower quadrant pain.  2. Fibroid uterus.  3. Menorrhagia.  4. Submucosal fibroid.   ANESTHESIA:  General.   SURGEON:  Maxie Better, M.D.   ASSISTANT:  None.   PROCEDURE:  Under adequate general anesthesia, the patient was placed in  the dorsal lithotomy position.  Examination under anesthesia revealed  about an 8-week size irregular anteverted uterus; no adnexal masses  could be appreciated.  The patient was then sterilely prepped and draped  in the usual fashion.  An indwelling Foley catheter was sterilely  placed.  A bivalve speculum was placed in the vagina.  A single-tooth  tenaculum was placed on the anterior lip of the cervix.  The cervix was  then dilated up to #23 [facility] dilator.  A Sorbitol- primed hysteroscope  was introduced into the uterine cavity.  The left tubal ostia could be  seen well.  There was some limitation and inability to fully evaluate  the endometrial cavity due to bleeding from the patient's residual  menses.  Due to there was limitation, the hysteroscope was removed.  The  cavity was then curetted for a moderate amount of tissue, and the cavity  was felt to be irregular.  The diagnostic hysteroscope was then  reinserted, at which time the left tubal ostia was seen well.  The right  tubal ostia appeared slightly sclerosed.   There was a posterior  submucosal fibroid that was evident, and a lower uterine segment  submucosal fibroid noted.  The diagnostic hysteroscope was removed.  The  cervix was then further dilated up to #25 [facility] dilator, and the  resectoscope was then inserted with a loop.  The loop setting was 180  cutting / 80 coag, was utilized to then resect the posterior fibroid;  and the anterior fibroid was then resected as well.  Pieces were  removed.  When the resection was felt to be adequate and the pieces  being removed, the resectoscope was removed.  The cavity was again  explored with the forceps clamped, and scraped as well -- no additional  tissue.  The procedure from the vaginal portion was felt to be complete.  An acorn cannula was then introduced into the cervical os and attached  to the tenaculum for manipulation of the uterus.  The bivalve speculum  was removed.  Sterile technique was then maintained and attention was  then turned to the abdomen.  The patient had a previous vertical  incision.  Therefore,  0.25% Marcaine was injected infraumbilically in a  vertical fashion, and incision vertically was made superior to the  previous scar, carried down to the rectus fascia.  Rectus fascia was  then opened.  The parietoperitoneum was entered and a pursestring of 0  Vicryl suture was then placed in the fascia.  A Hassan cannula was then  introduced, and a lighted video laparoscope was then introduced when the  high flow of carbon dioxide was started.  On entry into the abdominal  cavity there was no evidence of any trauma to the underlying organ  structure.  A panoramic view was then performed.  Normal liver edge was  noted.  Marcaine 0.25% was injected suprapubic and a small vertical  suprapubic incision was made through the previous scar.  Under direct  visualization a 5 mm port was then placed and a probe was then placed  through that port.  Inspection of the pelvis with manipulation of  the  vaginal apparatus was then performed.  The anterior and posterior cul-de-  sac was inspected.  No evidence of endometriosis.  The uterus had  multiple subserosal fibroid, with a suggestion of a prominent left  intramural/subserosal fibroid.  Both tubes were normal.  Both ovaries  were normal.  The left ovary had evidence of corpus luteal cysts, but  otherwise unremarkable.  The appendix was not seen.  There were no  adhesions noted in the left lower quadrant; no evidence of endometriosis  again.   At that point nothing further was necessary to be done.  The suprapubic  port was removed under direct visualization.  The abdomen was deflated  and the lighted video laparoscope was then utilized to remove the  infraumbilical port, taking care not to bring up any underlying  structure.  The pursestring suture of the fascia was then closed.  The  skin was approximated using 4-0 Vicryl subcuticular stitches.  Instruments of the vagina was removed.  The cervix was inspected, and  other than small bleeding from the tenaculum site ,which was hemostased  with pressure, no other issues were noted.   SPECIMEN:  Endometrial curetting and fibroid resections, all sent to  pathology.   ESTIMATED BLOOD LOSS:  Minimal.   FLUIDS:  Fluid deficit from the Sorbitol was 200 cc.  Intraoperative  fluid was 2 liters.  Urine output was quantity sufficient.   COMPLICATION:  None.   COUNTS:  Sponge and instrument counts were correct.   DISPOSITION:  The patient tolerated the procedure well and was  transferred to the recovery room in stable condition.      Maxie Better, M.D.  Electronically Signed     [place]/MEDQ  D:  02/06/2006  T:  02/06/2006  Job:  [id number]

## 2010-07-13 NOTE — Discharge Summary (Signed)
[name], [name]                ACCOUNT NO.:  [id number]   MEDICAL RECORD NO.:  [id number]          PATIENT TYPE:  INP   LOCATION:  4731                         FACILITY:  [facility]   PHYSICIAN:  Wilson Singer, M.D.DATE OF BIRTH:  18-Sep-1960   DATE OF ADMISSION:  05/28/2006  DATE OF DISCHARGE:  06/03/2006                               DISCHARGE SUMMARY   FINAL DISCHARGE DIAGNOSES:  1. Bronchitis.  2. Chronic cough.  3. Hyperlipidemia.  4. Hypertension.   MEDICATIONS ON DISCHARGE:  She will continue her home medications, which  include:  1. Atenolol 50 mg b.i.d.  2. TriCor 145 mg q.h.s.  3. Advair Diskus inhaler 100/50 one b.i.d.  4. Vytorin 10/80 one tablet daily.  5. Maxzide 25 one tablet daily.  6. Niferex 150 mg b.i.d.  7. Proventil inhaler p.r.n. q.i.d.   Also, she will go home on:  1. Avelox 400 mg daily for 5 more days.  2. Robitussin AC 10 mL q.i.d. p.r.n.   CONDITION ON DISCHARGE:  Stable.   INVESTIGATIONS:  CT chest scan, on May 29, 2006, which showed no  evidence of pulmonary embolism and no focal airspace consolidation.   HISTORY:  This lady of 50 years old was admitted with a prolonged cough  and shortness of breath.  She had seen her primary care physician and  had received 2 rounds of antibiotics, including Zithromax, Levaquin and  3 rounds of prednisone over the last 4-1/2 weeks prior to admission.  She has, apparently, not gotten better.  Dr. Renae Gloss, her primary care  physician, felt that she needed to be admitted to the hospital for  further evaluation.   HOSPITAL PROGRESS:  She was admitted to the floor and was started on  intravenous Avelox, the antibiotic of choice.  She had a CT chest scan  to rule out any other major pathologies such as a pulmonary embolism and  this was entirely negative.  Throughout her hospital stay, she remained  well apart from the frustrating cough.  Sedimentation rate was only 32  and an ANA serology was negative.   Her saturations, on room air, always  remained in the 98-100% range.  On clinical examination today, she looks  well, but still has a cough, although it is somewhat reduced after she  had been started on Robitussin with codeine.  There is no wheezing and  there is no fever.   On examination, temperature 98.1, blood pressure 112/72, pulse 81,  saturation, on room air, 98%.  Examination of her lungs shows she has a  few crackles in both bases, now more on the left than the right, in fact  the right crackle is almost cleared now.   FURTHER DISPOSITION:  I think my impression is that this lady probably  had a viral bronchitis, which is taking time to resolve.  There does not  appear to be any overt major pathology other than this.  I have told her  that she should improve in the course of the next few weeks.  I have  told her to follow up with her primary care physician,  Dr. Renae Gloss, in  the next week or two.      Wilson Singer, M.D.  Electronically Signed     NCG/MEDQ  D:  06/03/2006  T:  06/03/2006  Job:  [id number]

## 2010-07-25 ENCOUNTER — Ambulatory Visit (HOSPITAL_BASED_OUTPATIENT_CLINIC_OR_DEPARTMENT_OTHER)
Admission: RE | Admit: 2010-07-25 | Discharge: 2010-07-25 | Disposition: A | Discharge status: Home / Self Care | Payer: Worker's Compensation | Source: Ambulatory Visit | Attending: Orthopedic Surgery | Admitting: Orthopedic Surgery

## 2010-07-25 DIAGNOSIS — J45909 Unspecified asthma, uncomplicated: Secondary | ICD-10-CM | POA: Insufficient documentation

## 2010-07-25 DIAGNOSIS — S63519A Sprain of carpal joint of unspecified wrist, initial encounter: Secondary | ICD-10-CM | POA: Insufficient documentation

## 2010-07-25 DIAGNOSIS — X500XXA Overexertion from strenuous movement or load, initial encounter: Secondary | ICD-10-CM | POA: Insufficient documentation

## 2010-07-25 DIAGNOSIS — I10 Essential (primary) hypertension: Secondary | ICD-10-CM | POA: Insufficient documentation

## 2010-07-25 LAB — POCT I-STAT, CHEM 8
BUN: 11 mg/dL (ref 6–23)
Creatinine, Ser: 0.9 mg/dL (ref 0.4–1.2)
Glucose, Bld: 92 mg/dL (ref 70–99)
Hemoglobin: 12.9 g/dL (ref 12.0–15.0)
TCO2: 28 mmol/L (ref 0–100)

## 2010-09-14 NOTE — Op Note (Signed)
[name], [name]                ACCOUNT NO.:  [id number]  MEDICAL RECORD NO.:  [id number]           PATIENT TYPE:  LOCATION:                                 FACILITY:  PHYSICIAN:  Cindee Salt, M.D.       DATE OF BIRTH:  14-May-1960  DATE OF PROCEDURE: DATE OF DISCHARGE:                              OPERATIVE REPORT   PREOPERATIVE DIAGNOSIS:  Scapholunate ligament tear, right wrist.  POSTOPERATIVE DIAGNOSIS:  Scapholunate ligament tear, right wrist, with lunotriquetral ligament tear.  OPERATION:  Arthroscopy right wrist with shrinkage scapholunate debridement lunotriquetral with volar radial carpal ulnar ligament shrinkage right wrist.  SURGEON:  Cindee Salt, MD  ANESTHESIA:  Regional block.  ANESTHESIOLOGIST:  Janetta Hora. Frederick, MD  HISTORY:  The patient is a 50 year old female who suffered a twisting injury to her right wrist.  She has had pain since that time.  Our MRI reveals a probable scapholunate ligament injury.  She is admitted for arthroscopy, debridement, possible shrinkage, possible open repair scapholunate ligament complex right wrist.  She is well aware of risks and complications including infection, recurrence of injury to arteries, nerves, tendons, incomplete relief of symptoms, dystrophy, the possibility of arthritis developing over a period of time.  Pre, peri, postoperative course have been discussed.  In the preoperative area, the patient is seen, the extremity marked by both the patient and surgeon, antibiotic given.  PROCEDURE:  The patient was brought to the operating room where a supraclavicular block was carried out without difficulty under the direction of Dr. Gelene Mink using ultrasound.  She was prepped using ChloraPrep, supine position, right arm free.  A 3-minute dry time was allowed, time-out taken, confirming the patient and procedure.  The limb was placed in the arc arthroscopy tower, 10 pounds of traction applied. The joint inflated  3-4 portal.  Transverse incision was made, deepened with hemostat.  Blunt trocar was used to enter the joint, the joint was inspected.  A partial scapholunate ligament injury was immediately apparent with a partial tear proximally, stretching of the dorsal component which was intact, volar radial wrist ligaments were intact. There was some changes on the radial lunate.  A complete lunotriquetral ligament was then apparent with a significant synovitis on the ulnar aspect.  A irrigation catheter was placed in 6U.  A 6R portal opened after localization with 22-gauge needle.  Again, transverse incision made, deepened with hemostat.  Blunt trocar used to enter the joint. The scope was introduced into the ulnar aspect.  The lunotriquetral tear was immediately apparent.  The scapholunate ligament was found to be intact on the dorsal from 6 o'clock dorsally.  The midcarpal joint was then inspected.  There was significant tissue within the lumen of the scapholunate joint.  The lunotriquetral joint showed out moderate instability.  There were no significant cartilage changes on the proximal hamate or proximal capitate.  The scope was then reintroduced into the ulnar proximal row portal.  A shrinkage was then performed to the scapholunate ligament, maintaining adequate flow through the scope. The scope was then introduced into the 3-4 portal.  The shrinkage of the  ligament was visualized.  This had significantly tightened the scapholunate lunate joint with good realignment of the scaphoid.  The ulnar aspect of the wrist was attended to.  The torn lunotriquetral ligament was debrided with a full radius shaver and Arthur wand.  The volar ulnar ligaments, ulnolunate, ulnotriquetral ligaments were then shrunk.  Midcarpal joint was then reinspected.  The scapholunate ligament injury was converted from a Geissler II-III to a Plankinton I-II with the inability to place the probe all the way through.   The lunotriquetral joint continued to show mild instability.  The instruments were removed.  The portal was closed with interrupted 4-0 Vicryl Rapide sutures.  A sterile compressive dressing and dorsal palmar radial thumb spica splint applied.  The tourniquet was not used throughout the procedure.  She was taken to the recovery room for observation in satisfactory condition.  She will be discharged home to return to the [facility] in 1 week on [place] NX.          ______________________________ Cindee Salt, M.D.     GK/MEDQ  D:  07/25/2010  T:  07/26/2010  Job:  [id number]  Electronically Signed by Cindee Salt M.D. on 09/14/2010 09:15:35 AM

## 2010-12-11 LAB — URINE MICROSCOPIC-ADD ON

## 2010-12-11 LAB — URINALYSIS, ROUTINE W REFLEX MICROSCOPIC
Ketones, ur: NEGATIVE
Leukocytes, UA: NEGATIVE
Nitrite: NEGATIVE
Specific Gravity, Urine: 1.01
Urobilinogen, UA: 0.2
pH: 7.5

## 2010-12-12 LAB — BASIC METABOLIC PANEL
BUN: 8
Calcium: 9.5
GFR calc non Af Amer: >60
Potassium: 3.7

## 2010-12-12 LAB — CBC
HCT: 29.5 — ABNORMAL LOW
MCHC: 32.8
Platelets: 217
Platelets: 305
RBC: 4.27
RDW: 16 — ABNORMAL HIGH
WBC: 6.7
WBC: 9.8

## 2010-12-12 LAB — SAMPLE TO BLOOD BANK

## 2011-04-15 ENCOUNTER — Other Ambulatory Visit: Payer: Self-pay | Admitting: Orthopedic Surgery

## 2011-04-17 ENCOUNTER — Encounter (HOSPITAL_BASED_OUTPATIENT_CLINIC_OR_DEPARTMENT_OTHER)
Admission: RE | Admit: 2011-04-17 | Discharge: 2011-04-17 | Disposition: A | Discharge status: Home / Self Care | Payer: Worker's Compensation | Source: Ambulatory Visit | Attending: Orthopedic Surgery | Admitting: Orthopedic Surgery

## 2011-04-17 ENCOUNTER — Encounter (HOSPITAL_BASED_OUTPATIENT_CLINIC_OR_DEPARTMENT_OTHER): Payer: Self-pay | Admitting: *Deleted

## 2011-04-22 ENCOUNTER — Encounter (HOSPITAL_BASED_OUTPATIENT_CLINIC_OR_DEPARTMENT_OTHER)
Admission: RE | Admit: 2011-04-22 | Discharge: 2011-04-22 | Disposition: A | Discharge status: Home / Self Care | Payer: Worker's Compensation | Source: Ambulatory Visit | Attending: Orthopedic Surgery | Admitting: Orthopedic Surgery

## 2011-04-22 ENCOUNTER — Other Ambulatory Visit: Payer: Self-pay

## 2011-04-22 LAB — BASIC METABOLIC PANEL
CO2: 30 mEq/L (ref 19–32)
Chloride: 106 mEq/L (ref 96–112)
Creatinine, Ser: 0.69 mg/dL (ref 0.50–1.10)
GFR calc Af Amer: >90 mL/min (ref >90)
Potassium: 4.4 mEq/L (ref 3.5–5.1)
Sodium: 143 mEq/L (ref 135–145)

## 2011-04-24 ENCOUNTER — Ambulatory Visit (HOSPITAL_BASED_OUTPATIENT_CLINIC_OR_DEPARTMENT_OTHER): Payer: Worker's Compensation | Admitting: Anesthesiology

## 2011-04-24 ENCOUNTER — Encounter (HOSPITAL_BASED_OUTPATIENT_CLINIC_OR_DEPARTMENT_OTHER): Payer: Self-pay | Admitting: Anesthesiology

## 2011-04-24 ENCOUNTER — Ambulatory Visit (HOSPITAL_BASED_OUTPATIENT_CLINIC_OR_DEPARTMENT_OTHER)
Admission: RE | Admit: 2011-04-24 | Discharge: 2011-04-24 | Disposition: A | Discharge status: Home Health | Payer: Worker's Compensation | Source: Ambulatory Visit | Attending: Orthopedic Surgery | Admitting: Orthopedic Surgery

## 2011-04-24 ENCOUNTER — Encounter (HOSPITAL_BASED_OUTPATIENT_CLINIC_OR_DEPARTMENT_OTHER)
Admission: RE | Disposition: A | Discharge status: Home Health | Payer: Self-pay | Source: Ambulatory Visit | Attending: Orthopedic Surgery

## 2011-04-24 ENCOUNTER — Encounter (HOSPITAL_BASED_OUTPATIENT_CLINIC_OR_DEPARTMENT_OTHER): Payer: Self-pay | Admitting: Orthopedic Surgery

## 2011-04-24 DIAGNOSIS — I10 Essential (primary) hypertension: Secondary | ICD-10-CM | POA: Insufficient documentation

## 2011-04-24 DIAGNOSIS — J45909 Unspecified asthma, uncomplicated: Secondary | ICD-10-CM | POA: Insufficient documentation

## 2011-04-24 DIAGNOSIS — M24139 Other articular cartilage disorders, unspecified wrist: Secondary | ICD-10-CM | POA: Insufficient documentation

## 2011-04-24 HISTORY — DX: Essential (primary) hypertension: I10

## 2011-04-24 HISTORY — DX: Pure hypercholesterolemia, unspecified: E78.00

## 2011-04-24 HISTORY — DX: Respiratory conditions due to other specified external agents: J70.8

## 2011-04-24 HISTORY — PX: CARPECTOMY: SHX5004

## 2011-04-24 LAB — POCT HEMOGLOBIN-HEMACUE: Hemoglobin: 12.9 g/dL (ref 12.0–15.0)

## 2011-04-24 SURGERY — CARPECTOMY
Anesthesia: Monitor Anesthesia Care | Site: Wrist | Laterality: Right | Wound class: Clean

## 2011-04-24 MED ORDER — MIDAZOLAM HCL 2 MG/2ML IJ SOLN
0.5000 mg | INTRAMUSCULAR | Status: DC | PRN
  Administered 2011-04-24: 2 mg via INTRAVENOUS

## 2011-04-24 MED ORDER — LIDOCAINE HCL 1 % IJ SOLN
INTRAMUSCULAR | Status: DC | PRN
  Administered 2011-04-24: 2 mL via INTRADERMAL

## 2011-04-24 MED ORDER — CHLORHEXIDINE GLUCONATE 4 % EX LIQD: 60.0000 mL | Freq: Once | CUTANEOUS | Status: DC

## 2011-04-24 MED ORDER — ROPIVACAINE HCL 5 MG/ML IJ SOLN
INTRAMUSCULAR | Status: DC | PRN
  Administered 2011-04-24: 25 mL via EPIDURAL

## 2011-04-24 MED ORDER — MORPHINE SULFATE 4 MG/ML IJ SOLN: 0.0500 mg/kg | INTRAMUSCULAR | Status: DC | PRN

## 2011-04-24 MED ORDER — MIDAZOLAM HCL 5 MG/5ML IJ SOLN
INTRAMUSCULAR | Status: DC | PRN
  Administered 2011-04-24: 1 mg via INTRAVENOUS

## 2011-04-24 MED ORDER — DEXAMETHASONE SODIUM PHOSPHATE 4 MG/ML IJ SOLN
INTRAMUSCULAR | Status: DC | PRN
  Administered 2011-04-24: 4 mg via INTRAVENOUS

## 2011-04-24 MED ORDER — FENTANYL CITRATE 0.05 MG/ML IJ SOLN
50.0000 ug | INTRAMUSCULAR | Status: DC | PRN
  Administered 2011-04-24: 100 ug via INTRAVENOUS

## 2011-04-24 MED ORDER — PROPOFOL 10 MG/ML IV EMUL
INTRAVENOUS | Status: DC | PRN
  Administered 2011-04-24: 75 ug/kg/min via INTRAVENOUS

## 2011-04-24 MED ORDER — ONDANSETRON HCL 4 MG/2ML IJ SOLN
INTRAMUSCULAR | Status: DC | PRN
  Administered 2011-04-24: 4 mg via INTRAVENOUS

## 2011-04-24 MED ORDER — LIDOCAINE HCL (CARDIAC) 20 MG/ML IV SOLN
INTRAVENOUS | Status: DC | PRN
  Administered 2011-04-24: 60 mg via INTRAVENOUS

## 2011-04-24 MED ORDER — FENTANYL CITRATE 0.05 MG/ML IJ SOLN: 25.0000 ug | INTRAMUSCULAR | Status: DC | PRN

## 2011-04-24 MED ORDER — CEFAZOLIN SODIUM-DEXTROSE 2-3 GM-% IV SOLR
2.0000 g | INTRAVENOUS | Status: AC
  Administered 2011-04-24: 2 g via INTRAVENOUS

## 2011-04-24 MED ORDER — METOCLOPRAMIDE HCL 5 MG/ML IJ SOLN: 10.0000 mg | Freq: Once | INTRAMUSCULAR | Status: DC | PRN

## 2011-04-24 MED ORDER — LACTATED RINGERS IV SOLN
INTRAVENOUS | Status: DC
  Administered 2011-04-24 (×2): via INTRAVENOUS

## 2011-04-24 SURGICAL SUPPLY — 60 items
APL SKNCLS STERI-STRIP NONHPOA (GAUZE/BANDAGES/DRESSINGS) ×1
BANDAGE COBAN STERILE 3 (GAUZE/BANDAGES/DRESSINGS) ×1 IMPLANT
BANDAGE GAUZE ELAST BULKY 4 IN (GAUZE/BANDAGES/DRESSINGS) ×2 IMPLANT
BENZOIN TINCTURE PRP APPL 2/3 (GAUZE/BANDAGES/DRESSINGS) ×1 IMPLANT
BLADE ARTHRO LOK 4 BEAVER (BLADE) ×2 IMPLANT
BLADE EAR TYMPAN 2.5 60D BEAV (BLADE) IMPLANT
BLADE MINI RND TIP GREEN BEAV (BLADE) ×2 IMPLANT
BLADE SURG 15 STRL LF DISP TIS (BLADE) ×1 IMPLANT
BLADE SURG 15 STRL SS (BLADE) ×2
BNDG CMPR 9X4 STRL LF SNTH (GAUZE/BANDAGES/DRESSINGS) ×1
BNDG COHESIVE 3X5 TAN STRL LF (GAUZE/BANDAGES/DRESSINGS) ×2 IMPLANT
BNDG ESMARK 4X9 LF (GAUZE/BANDAGES/DRESSINGS) ×2 IMPLANT
CHLORAPREP W/TINT 26ML (MISCELLANEOUS) ×2 IMPLANT
CLOTH BEACON ORANGE TIMEOUT ST (SAFETY) ×2 IMPLANT
CORDS BIPOLAR (ELECTRODE) ×2 IMPLANT
COVER MAYO STAND STRL (DRAPES) ×2 IMPLANT
COVER TABLE BACK 60X90 (DRAPES) ×2 IMPLANT
CUFF TOURNIQUET SINGLE 18IN (TOURNIQUET CUFF) ×1 IMPLANT
DECANTER SPIKE VIAL GLASS SM (MISCELLANEOUS) IMPLANT
DRAPE EXTREMITY T 121X128X90 (DRAPE) ×2 IMPLANT
DRAPE OEC MINIVIEW 54X84 (DRAPES) ×2 IMPLANT
DRAPE SURG 17X23 STRL (DRAPES) ×2 IMPLANT
GAUZE XEROFORM 1X8 LF (GAUZE/BANDAGES/DRESSINGS) ×2 IMPLANT
GLOVE BIO SURGEON STRL SZ 6.5 (GLOVE) ×2 IMPLANT
GLOVE BIOGEL PI IND STRL 7.0 (GLOVE) IMPLANT
GLOVE BIOGEL PI IND STRL 8.5 (GLOVE) ×1 IMPLANT
GLOVE BIOGEL PI INDICATOR 7.0 (GLOVE) ×1
GLOVE BIOGEL PI INDICATOR 8.5 (GLOVE) ×1
GLOVE SURG ORTHO 8.0 STRL STRW (GLOVE) ×2 IMPLANT
GLOVE SURG SS PI 8.5 STRL IVOR (GLOVE) ×1
GLOVE SURG SS PI 8.5 STRL STRW (GLOVE) ×1 IMPLANT
GOWN BRE IMP PREV XXLGXLNG (GOWN DISPOSABLE) ×2 IMPLANT
GOWN PREVENTION PLUS XLARGE (GOWN DISPOSABLE) ×2 IMPLANT
NS IRRIG 1000ML POUR BTL (IV SOLUTION) ×2 IMPLANT
PACK BASIN DAY SURGERY FS (CUSTOM PROCEDURE TRAY) ×2 IMPLANT
PAD CAST 3X4 CTTN HI CHSV (CAST SUPPLIES) ×1 IMPLANT
PADDING CAST ABS 3INX4YD NS (CAST SUPPLIES)
PADDING CAST ABS 4INX4YD NS (CAST SUPPLIES) ×1
PADDING CAST ABS COTTON 3X4 (CAST SUPPLIES) IMPLANT
PADDING CAST ABS COTTON 4X4 ST (CAST SUPPLIES) ×1 IMPLANT
PADDING CAST COTTON 3X4 STRL (CAST SUPPLIES) ×2
PADDING WEBRIL 4 STERILE (GAUZE/BANDAGES/DRESSINGS) ×1 IMPLANT
SLEEVE SCD COMPRESS KNEE MED (MISCELLANEOUS) ×2 IMPLANT
SPLINT PLASTER CAST XFAST 3X15 (CAST SUPPLIES) IMPLANT
SPLINT PLASTER XTRA FASTSET 3X (CAST SUPPLIES) ×10
SPONGE GAUZE 4X4 12PLY (GAUZE/BANDAGES/DRESSINGS) ×2 IMPLANT
STOCKINETTE 4X48 STRL (DRAPES) ×2 IMPLANT
STRIP CLOSURE SKIN 1/2X4 (GAUZE/BANDAGES/DRESSINGS) ×1 IMPLANT
SUT MERSILENE 3 0 FS 1 (SUTURE) ×2 IMPLANT
SUT VIC AB 0 CT1 27 (SUTURE)
SUT VIC AB 0 CT1 27XBRD ANBCTR (SUTURE) IMPLANT
SUT VIC AB 2-0 SH 27 (SUTURE) ×2
SUT VIC AB 2-0 SH 27XBRD (SUTURE) IMPLANT
SUT VICRYL 4-0 PS2 18IN ABS (SUTURE) ×2 IMPLANT
SUT VICRYL RAPIDE 4/0 PS 2 (SUTURE) ×2 IMPLANT
SYR BULB 3OZ (MISCELLANEOUS) ×2 IMPLANT
SYR CONTROL 10ML LL (SYRINGE) IMPLANT
TOWEL OR 17X24 6PK STRL BLUE (TOWEL DISPOSABLE) ×2 IMPLANT
UNDERPAD 30X30 INCONTINENT (UNDERPADS AND DIAPERS) ×2 IMPLANT
WATER STERILE IRR 1000ML POUR (IV SOLUTION) ×2 IMPLANT

## 2011-04-24 NOTE — Anesthesia Postprocedure Evaluation (Signed)
Anesthesia Post Note  Patient: [name]  Procedure(s) Performed: Procedure(s) (LRB): CARPECTOMY (Right)  Anesthesia type: General  Patient location: PACU  Post pain: Pain level controlled  Post assessment: Patient's Cardiovascular Status Stable  Last Vitals:  Filed Vitals:   04/24/11 1449  BP: 140/67  Pulse: 68  Temp: 36.4 C  Resp: 16    Post vital signs: Reviewed and stable  Level of consciousness: alert  Complications: No apparent anesthesia complications

## 2011-04-24 NOTE — Brief Op Note (Signed)
04/24/2011  1:23 PM  PATIENT:  [name]  51 y.o. female  PRE-OPERATIVE DIAGNOSIS:  sl/lt tear right wrist  POST-OPERATIVE DIAGNOSIS:  ScaphoLunate and LunoTriquetral Tear Right Wrist  PROCEDURE:  Procedure(s) (LRB): CARPECTOMY (Right)  SURGEON:  Surgeon(s) and Role:    * Nicki Reaper, MD - Primary  PHYSICIAN ASSISTANT:   ASSISTANTS: none   ANESTHESIA:   regional and general  EBL:  Total I/O In: 1500 [I.V.:1500] Out: -   BLOOD ADMINISTERED:none  DRAINS: none   LOCAL MEDICATIONS USED:  NONE  SPECIMEN:  No Specimen  DISPOSITION OF SPECIMEN:  N/A  COUNTS:  YES  TOURNIQUET:   Total Tourniquet Time Documented: Upper Arm (Right) - 51 minutes  DICTATION: .Other Dictation: Dictation Number [id number]  PLAN OF CARE: Discharge to home after PACU  PATIENT DISPOSITION:  PACU - hemodynamically stable.

## 2011-04-24 NOTE — Op Note (Signed)
Dictated number: [id number]

## 2011-04-24 NOTE — Discharge Instructions (Signed)
[facility] Instructions Hand Surgery  Wound Care: Keep your hand elevated above the level of your heart.  Do not allow it to dangle  by your side.  Keep the dressing dry and do not remove it unless your doctor advises you to do so.  He will usually change it at the time of your post-op visit.  Moving your fingers is advised to stimulate circulation but will depend on the site of your surgery.  If you have a splint applied, your doctor will advise you regarding movement.  Activity: Do not drive or operate machinery today.  Rest today and then you may return to your normal activity and work as indicated by your physician.  Diet:  Drink liquids today or eat a light diet.  You may resume a regular diet tomorrow.    General expectations: Pain for two to three days. Fingers may become slightly swollen.  Call your doctor if any of the following occur: Severe pain not relieved by pain medication. Elevated temperature. Dressing soaked with blood. Inability to move fingers. White or bluish color to fingers.  [facility]  [address] [place], [place] [address] [phone]   Post Anesthesia Home Care Instructions  Activity: Get plenty of rest for the remainder of the day. A responsible adult should stay with you for 24 hours following the procedure.  For the next 24 hours, DO NOT: -Drive a car -Operate machinery -Drink alcoholic beverages -Take any medication unless instructed by your physician -Make any legal decisions or sign important papers.  Meals: Start with liquid foods such as gelatin or soup. Progress to regular foods as tolerated. Avoid greasy, spicy, heavy foods. If nausea and/or vomiting occur, drink only clear liquids until the nausea and/or vomiting subsides. Call your physician if vomiting continues.  Special Instructions/Symptoms: Your throat may feel dry or sore from the anesthesia or the breathing tube placed in your throat during surgery. If  this causes discomfort, gargle with warm salt water. The discomfort should disappear within 24 hours.    Regional Anesthesia Blocks  1. Numbness or the inability to move the "blocked" extremity may last from 3-48 hours after placement. The length of time depends on the medication injected and your individual response to the medication. If the numbness is not going away after 48 hours, call your surgeon.  2. The extremity that is blocked will need to be protected until the numbness is gone and the  Strength has returned. Because you cannot feel it, you will need to take extra care to avoid injury. Because it may be weak, you may have difficulty moving it or using it. You may not know what position it is in without looking at it while the block is in effect.  3. For blocks in the legs and feet, returning to weight bearing and walking needs to be done carefully. You will need to wait until the numbness is entirely gone and the strength has returned. You should be able to move your leg and foot normally before you try and bear weight or walk. You will need someone to be with you when you first try to ensure you do not fall and possibly risk injury.  4. Bruising and tenderness at the needle site are common side effects and will resolve in a few days.  5. Persistent numbness or new problems with movement should be communicated to the surgeon or the  Surgery Center ([phone]).  

## 2011-04-24 NOTE — Transfer of Care (Signed)
Immediate Anesthesia Transfer of Care Note  Patient: [name]  Procedure(s) Performed: Procedure(s) (LRB): CARPECTOMY (Right)  Patient Location: PACU  Anesthesia Type: MAC combined with regional for post-op pain  Level of Consciousness: awake, alert , oriented and patient cooperative  Airway & Oxygen Therapy: Patient Spontanous Breathing and Patient connected to face mask oxygen  Post-op Assessment: Report given to PACU RN and Post -op Vital signs reviewed and stable  Post vital signs: Reviewed and stable  Complications: No apparent anesthesia complications

## 2011-04-24 NOTE — H&P (Signed)
[name] is a 51 year old right hand dominant female who is seen for a 2nd opinion with respect to an injury to her right hand and wrist. 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 It twisted her wrist over into a forced supination type injury. She had a 2nd injury on 10-3 very similar to the first. She complains of pain in the central dorsal aspect of her wrist. She was seen at [facility] where x-rays were taken and she was told she had a sprain. She was placed in a splint intermittently. She subsequently saw Dr. Melvyn Novas in October or November. He followed treating her conservatively with splinting, anti-inflammatories, 2 injections. He has subsequently obtained an MRI revealing a probable scapholunate questionable lunotriquetral ligament tear. She complains of pain in the central aspect of the wrist. This is constant, occasionally extremely severe measuring anywhere between 2-10 on a 0-10 scale. She complains of swelling and weakness. She states it is not improving and that it will occasionally awaken her from sleep. Activity and work makes this worse. She has been taking ibuprofen 800 mg and using ice for swelling. She has a prior history of injury to that wrist in 1984 treated with a long arm cast. She states however the problem that she had at that time was on the ulnar aspect.  [name] returns to the office today for examination of her right wrist scapholunate lunotriquetral tear.  She continues to complain of pain.  She would like to proceed to have this rectified as much as possible while trying to maintain mobility.  We would recommend a proximal row carpectomy.   PAST MEDICAL HISTORY:  She is allergic to Macrobid, Vicodin, Tylox and Minocin. MEDICATIONS:  Crestor, Trilipix, Lovaza, Diovan HCT, Alvesco inhaler, Albuterol inhaler, Singulair, Motrin, Flexeril, Voltaren gel, Tylenol with codeine and folic acid.   SURGICAL  HISTORY:  Abdominal exploratory surgery, knee arthroscopy, kidney lithotripsy, LEEP procedure and D&C. FAMILY MEDICAL HISTORY:  Positive for diabetes, heart disease, and high BP. SOCIAL HISTORY:  She does not smoke. She drinks socially. She is married and a Runner, broadcasting/film/video. REVIEW OF SYSTEMS:   Positive for contacts, high BP, asthma and headaches, otherwise negative. [name] is an 51 y.o. female.    Chief Complaint: SL LT tear Rt HPI: see above  Past Medical History  Diagnosis Date  . Hypertension   . Asthma   . Elevated cholesterol   . 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 Diagnostic laparoscopy   . Abdominal hysterectomy   . Knee arthroscopy     rt  . Combined hysteroscopy diagnostic / d&c   . Wrist arthroscopy w/ triangular fibrocartilage repair     2012  . Kidney stone surgery     History reviewed. No pertinent family history. Social History:  reports that she has never smoked. She has never used smokeless tobacco. She reports that she drinks alcohol. She reports that she does not use illicit drugs.  Allergies:  Allergies  Allergen Reactions  . Vicodin (Hydrocodone-Acetaminophen) Shortness Of Breath  . Minocin   . Tylox Other (See Comments)    Tylox and minocin cause abd cramping  . Macrobid Rash    No current facility-administered medications on file as of .   Medications Prior to Admission  Medication Sig Dispense Refill  . ciclesonide (ALVESCO) 160 MCG/ACT inhaler  Inhale 1 puff into the lungs 2 (two) times daily.      . fexofenadine (ALLEGRA) 180 MG tablet Take 180 mg by mouth as needed.      . valsartan-hydrochlorothiazide (DIOVAN-HCT) 320-25 MG per tablet Take 1 tablet by mouth daily.      Marland Kitchen albuterol (PROVENTIL HFA;VENTOLIN HFA) 108 (90 BASE) MCG/ACT inhaler Inhale 2 puffs into the lungs every 6 (six) hours as needed.        Results for orders placed during the hospital encounter of 04/24/11  (from the past 48 hour(s))  BASIC METABOLIC PANEL     Status: Normal   Collection Time   04/22/11  3:25 PM      Component Value Range Comment   Sodium 143  135 - 145 (mEq/L)    Potassium 4.4  3.5 - 5.1 (mEq/L)    Chloride 106  96 - 112 (mEq/L)    CO2 30  19 - 32 (mEq/L)    Glucose, Bld 86  70 - 99 (mg/dL)    BUN 10  6 - 23 (mg/dL)    Creatinine, Ser 1.61  0.50 - 1.10 (mg/dL)    Calcium 09.6  8.4 - 10.5 (mg/dL)    GFR calc non Af Amer >90  >90 (mL/min)    GFR calc Af Amer >90  >90 (mL/min)     No results found.   Pertinent items are noted in HPI.  Height 5\' 2"  (1.575 m), weight 58.968 kg (130 lb).  General appearance: alert, cooperative and appears stated age Head: Normocephalic, without obvious abnormality, atraumatic Neck: no adenopathy Resp: clear to auscultation bilaterally Cardio: regular rate and rhythm, S1, S2 normal, no murmur, click, rub or gallop GI: soft, non-tender; bowel sounds normal; no masses,  no organomegaly Extremities: extremities normal, atraumatic, no cyanosis or edema Pulses: 2+ and symmetric Skin: Skin color, texture, turgor normal. No rashes or lesions Neurologic: Grossly normal Incision/Wound: na  Assessment/Plan  She is aware of the risks and complications including infection, recurrence, injury to arteries, nerves, tendons, incomplete relief of symptoms and dystrophy. She is scheduled for proximal row carpectomy right wrist as an outpatient. She does not want a fusion.    [name] R 04/24/2011, 10:31 AM

## 2011-04-24 NOTE — Anesthesia Preprocedure Evaluation (Addendum)
Anesthesia Evaluation  Patient identified by MRN, date of birth, ID band Patient awake    Reviewed: Allergy & Precautions, H&P , NPO status , Patient's Chart, lab work & pertinent test results, reviewed documented beta blocker date and time   Airway Mallampati: II TM Distance: >3 FB Neck ROM: full    Dental   Pulmonary asthma ,          Cardiovascular hypertension, On Medications     Neuro/Psych Negative Neurological ROS  Negative Psych ROS   GI/Hepatic negative GI ROS, Neg liver ROS,   Endo/Other  Negative Endocrine ROS  Renal/GU negative Renal ROS  Genitourinary negative   Musculoskeletal   Abdominal   Peds  Hematology negative hematology ROS (+)   Anesthesia Other Findings See surgeon's H&P   Reproductive/Obstetrics negative OB ROS                           Anesthesia Physical Anesthesia Plan  ASA: II  Anesthesia Plan: MAC   Post-op Pain Management: MAC Combined w/ Regional for Post-op pain   Induction: Intravenous  Airway Management Planned: Simple Face Mask  Additional Equipment:   Intra-op Plan:   Post-operative Plan:   Informed Consent: I have reviewed the patients History and Physical, chart, labs and discussed the procedure including the risks, benefits and alternatives for the proposed anesthesia with the patient or authorized representative who has indicated his/her understanding and acceptance.     Plan Discussed with: CRNA and Surgeon  Anesthesia Plan Comments:        Anesthesia Quick Evaluation

## 2011-04-24 NOTE — Progress Notes (Signed)
Assisted Dr. Frederick with right, ultrasound guided, supraclavicular block. Side rails up, monitors on throughout procedure. See vital signs in flow sheet. Tolerated Procedure well. 

## 2011-04-24 NOTE — Anesthesia Procedure Notes (Addendum)
Anesthesia Regional Block:  Supraclavicular block  Pre-Anesthetic Checklist: ,, timeout performed, Correct Patient, Correct Site, Correct Laterality, Correct Procedure, Correct Position, site marked, Risks and benefits discussed,  Surgical consent,  Pre-op evaluation,  At surgeon's request and post-op pain management  Laterality: Right  Prep: chloraprep       Needles:   Needle Type: Other   (Arrow Echogenic)   Needle Length: 9cm  Needle Gauge: 21    Additional Needles:  Procedures: ultrasound guided Supraclavicular block Narrative:  Start time: 04/24/2011 11:44 AM End time: 04/24/2011 11:50 AM Injection made incrementally with aspirations every 5 mL.  Performed by: Personally  Anesthesiologist: C Frederick  Additional Notes: Ultrasound guidance used to: id relevant anatomy, confirm needle position, local anesthetic spread, avoidance of vascular puncture. Picture saved. No complications. Block performed personally by Janetta Hora. Gelene Mink, MD    Supraclavicular block Procedure Name: MAC Date/Time: 04/24/2011 12:10 PM Performed by: Stephanieann Popescu D Pre-anesthesia Checklist: Emergency Drugs available, Suction available and Patient being monitored Oxygen Delivery Method: Simple face mask

## 2011-04-25 ENCOUNTER — Encounter (HOSPITAL_BASED_OUTPATIENT_CLINIC_OR_DEPARTMENT_OTHER): Payer: Self-pay | Admitting: Orthopedic Surgery

## 2011-04-25 NOTE — Op Note (Signed)
NAME:  [name], [name]                      ACCOUNT NO.:  MEDICAL RECORD NO.:  [id number]  LOCATION:                                 FACILITY:  PHYSICIAN:  Cindee Salt, M.D.            DATE OF BIRTH:  DATE OF PROCEDURE:  04/24/2011 DATE OF DISCHARGE:                              OPERATIVE REPORT   PREOPERATIVE DIAGNOSIS:  Scapholunate lunotriquetral tear, right wrist.  POSTOPERATIVE DIAGNOSIS:  Scapholunate lunotriquetral tear, right wrist.  OPERATION:  Proximal row carpectomy, right wrist.  SURGEON:  Cindee Salt, MD  ANESTHESIA:  Supraclavicular block general.  ANESTHESIOLOGIST:  Janetta Hora. Gelene Mink, MD  HISTORY:  The patient is a 51 year old female with a history of right wrist pain.  MRI reveals a scapholunate ligament tear, arthroscopies revealed a scapholunate lunotriquetral tear.  Shrinkage has not provided relief of symptoms before.  She has rotation of the scapholunate with a midcarpal type instability.  Pre, peri, postoperative course for proximal row carpectomy has been discussed having offered partial wrist fusion or complete wrist fusion.  Conservative treatment has failed over a long course.  She has elected to proceed with a proximal row carpectomy.  Pre, peri, postoperative course have been discussed.  She is aware that there is no guarantee with surgery, possibility of infection, recurrence injury to arteries, nerves, tendons, incomplete relief of symptoms and dystrophy.  In preoperative area, the patient was seen, the extremity marked by both the patient and surgeon.  Antibiotic given.  PROCEDURE:  The patient was brought to the operating room.  A supraclavicular block was carried out without difficulty along with general anesthetic.  She was prepped using ChloraPrep, supine position, right arm free.  A 3 minutes dry time was allowed.  Time-out taken confirming the patient and procedure.  A longitudinal incision was made over the dorsum of the hand after  exsanguination of the limb with an Esmarch bandage and inflation of tourniquet to 250 mmHg.  Bleeders were electrocauterized with bipolar.  The extensor retinaculum was split. The capsule identified.  Significant shifting of the carpus was immediately apparent.  The capsule was opened leaving a rim proximally. The posterior interosseous nerve was resected over approximately a 0.5 cm with cauterization of the proximal aspect.  The proximal portion of the wound was teased allowing visualization of the scapholunate lunotriquetral interval along with each of the proximal row bones along with the capitate.  The capitate did not show any significant changes. There was significant rotation of the lunate onto the capitate and significant rotation of the scaphoid on to the capitate.  Significant adherence was present between the dorsal capsule of the lunate and the capitate.  This was resected.  The capitate showed no significant changes.  The scaphoid was then removed in a piecemeal fashion taking care to protect the radioscaphocapitate ligament.  The lunotriquetral tear showed portion of the ligament only intact, this was very minor in nature, no further ligament was present.  The triquetrum was then removed with blunt and sharp dissection, taking care to protect the ulnar ligaments.  The lunate was then clamped with a perforating towel clip,  this allowed rotation of it to allow subperiosteal dissection of the volar ligaments preserving these to the capitate, the lunate was removed in toto.  The capitate immediately translocated proximally.  The radial styloid did not appear to impinge or feel impinging onto the trapezium, as such radial styloidectomy was not performed.  The wound was copiously irrigated with saline.  X-rays confirmed that the capitate translocated proximally into the lunate fossa.  The capsule was then closed with 2-0 Mersilene sutures, the retinaculum with interrupted  2-0 Vicryl sutures, subcutaneous tissue with interrupted 4-0 Vicryl, and the skin with a subcuticular 4-0 Vicryl Rapide sutures.  Steri-Strips were applied.  A sterile compressive dressing and dorsal palmar splint applied.  X-rays taken revealed that the capitate was maintained in the lunate fossa.  Once the splint had hardened, the tourniquet was deflated, all fingers immediately pinked, and she was taken to the recovery room for observation in satisfactory condition.          ______________________________ Cindee Salt, M.D.     GK/MEDQ  D:  04/24/2011  T:  04/24/2011  Job:  [id number]

## 2012-12-14 ENCOUNTER — Other Ambulatory Visit (HOSPITAL_COMMUNITY): Payer: Self-pay | Admitting: Internal Medicine

## 2012-12-14 DIAGNOSIS — Z1231 Encounter for screening mammogram for malignant neoplasm of breast: Secondary | ICD-10-CM

## 2012-12-24 ENCOUNTER — Ambulatory Visit (HOSPITAL_COMMUNITY)
Admission: RE | Admit: 2012-12-24 | Discharge: 2012-12-24 | Disposition: A | Discharge status: Home / Self Care | Payer: BC Managed Care – PPO | Source: Ambulatory Visit | Attending: Internal Medicine | Admitting: Internal Medicine

## 2012-12-24 DIAGNOSIS — Z1231 Encounter for screening mammogram for malignant neoplasm of breast: Secondary | ICD-10-CM | POA: Insufficient documentation

## 2014-05-30 ENCOUNTER — Other Ambulatory Visit (HOSPITAL_COMMUNITY): Payer: Self-pay | Admitting: Internal Medicine

## 2014-05-30 DIAGNOSIS — Z1231 Encounter for screening mammogram for malignant neoplasm of breast: Secondary | ICD-10-CM

## 2014-06-09 ENCOUNTER — Ambulatory Visit (HOSPITAL_COMMUNITY)
Admission: RE | Admit: 2014-06-09 | Discharge: 2014-06-09 | Disposition: A | Discharge status: Home / Self Care | Payer: 59 | Source: Ambulatory Visit | Attending: Internal Medicine | Admitting: Internal Medicine

## 2014-06-09 ENCOUNTER — Other Ambulatory Visit (HOSPITAL_COMMUNITY): Payer: Self-pay | Admitting: Internal Medicine

## 2014-06-09 DIAGNOSIS — Z1231 Encounter for screening mammogram for malignant neoplasm of breast: Secondary | ICD-10-CM

## 2015-03-30 ENCOUNTER — Other Ambulatory Visit: Payer: Self-pay | Admitting: Otolaryngology

## 2015-04-11 ENCOUNTER — Encounter (HOSPITAL_BASED_OUTPATIENT_CLINIC_OR_DEPARTMENT_OTHER): Payer: Self-pay | Admitting: *Deleted

## 2015-04-12 ENCOUNTER — Other Ambulatory Visit: Payer: Self-pay

## 2015-04-12 ENCOUNTER — Encounter (HOSPITAL_BASED_OUTPATIENT_CLINIC_OR_DEPARTMENT_OTHER)
Admission: RE | Admit: 2015-04-12 | Discharge: 2015-04-12 | Disposition: A | Discharge status: Home / Self Care | Payer: Managed Care, Other (non HMO) | Source: Ambulatory Visit | Attending: Otolaryngology | Admitting: Otolaryngology

## 2015-04-12 DIAGNOSIS — E119 Type 2 diabetes mellitus without complications: Secondary | ICD-10-CM | POA: Insufficient documentation

## 2015-04-12 DIAGNOSIS — Z01810 Encounter for preprocedural cardiovascular examination: Secondary | ICD-10-CM | POA: Diagnosis not present

## 2015-04-12 DIAGNOSIS — Z01812 Encounter for preprocedural laboratory examination: Secondary | ICD-10-CM | POA: Insufficient documentation

## 2015-04-12 LAB — BASIC METABOLIC PANEL
ANION GAP: 9 (ref 5–15)
BUN: 10 mg/dL (ref 6–20)
CALCIUM: 9.3 mg/dL (ref 8.9–10.3)
CO2: 27 mmol/L (ref 22–32)
Chloride: 108 mmol/L (ref 101–111)
Creatinine, Ser: 0.67 mg/dL (ref 0.44–1.00)
GLUCOSE: 104 mg/dL — AB (ref 65–99)
Potassium: 4.3 mmol/L (ref 3.5–5.1)
Sodium: 144 mmol/L (ref 135–145)

## 2015-04-18 ENCOUNTER — Encounter (HOSPITAL_BASED_OUTPATIENT_CLINIC_OR_DEPARTMENT_OTHER): Payer: Self-pay | Admitting: *Deleted

## 2015-04-18 ENCOUNTER — Ambulatory Visit (HOSPITAL_BASED_OUTPATIENT_CLINIC_OR_DEPARTMENT_OTHER)
Admission: RE | Admit: 2015-04-18 | Discharge: 2015-04-18 | Disposition: A | Discharge status: Home / Self Care | Payer: Managed Care, Other (non HMO) | Source: Ambulatory Visit | Attending: Otolaryngology | Admitting: Otolaryngology

## 2015-04-18 ENCOUNTER — Encounter (HOSPITAL_BASED_OUTPATIENT_CLINIC_OR_DEPARTMENT_OTHER)
Admission: RE | Disposition: A | Discharge status: Home / Self Care | Payer: Self-pay | Source: Ambulatory Visit | Attending: Otolaryngology

## 2015-04-18 ENCOUNTER — Ambulatory Visit (HOSPITAL_BASED_OUTPATIENT_CLINIC_OR_DEPARTMENT_OTHER): Payer: Managed Care, Other (non HMO) | Admitting: Anesthesiology

## 2015-04-18 DIAGNOSIS — J45909 Unspecified asthma, uncomplicated: Secondary | ICD-10-CM | POA: Insufficient documentation

## 2015-04-18 DIAGNOSIS — M1990 Unspecified osteoarthritis, unspecified site: Secondary | ICD-10-CM | POA: Diagnosis not present

## 2015-04-18 DIAGNOSIS — K219 Gastro-esophageal reflux disease without esophagitis: Secondary | ICD-10-CM | POA: Diagnosis not present

## 2015-04-18 DIAGNOSIS — I10 Essential (primary) hypertension: Secondary | ICD-10-CM | POA: Diagnosis not present

## 2015-04-18 DIAGNOSIS — Q892 Congenital malformations of other endocrine glands: Secondary | ICD-10-CM | POA: Diagnosis not present

## 2015-04-18 DIAGNOSIS — R221 Localized swelling, mass and lump, neck: Secondary | ICD-10-CM | POA: Diagnosis present

## 2015-04-18 HISTORY — PX: THYROGLOSSAL DUCT CYST: SHX297

## 2015-04-18 HISTORY — DX: Complex regional pain syndrome I, unspecified: G90.50

## 2015-04-18 HISTORY — DX: Gastro-esophageal reflux disease without esophagitis: K21.9

## 2015-04-18 HISTORY — DX: Unspecified osteoarthritis, unspecified site: M19.90

## 2015-04-18 HISTORY — DX: Congenital malformations of other endocrine glands: Q89.2

## 2015-04-18 HISTORY — DX: Chronic kidney disease, unspecified: N18.9

## 2015-04-18 SURGERY — EXCISION, THYROGLOSSAL DUCT CYST
Anesthesia: General | Site: Neck

## 2015-04-18 MED ORDER — DEXAMETHASONE SODIUM PHOSPHATE 10 MG/ML IJ SOLN
INTRAMUSCULAR | Status: AC
  Filled 2015-04-18: qty 1

## 2015-04-18 MED ORDER — GLYCOPYRROLATE 0.2 MG/ML IJ SOLN: 0.2000 mg | Freq: Once | INTRAMUSCULAR | Status: DC | PRN

## 2015-04-18 MED ORDER — PHENYLEPHRINE HCL 10 MG/ML IJ SOLN
INTRAMUSCULAR | Status: DC | PRN
  Administered 2015-04-18 (×2): 40 ug via INTRAVENOUS

## 2015-04-18 MED ORDER — PENTAZOCINE-NALOXONE HCL 50-0.5 MG PO TABS: 1.0000 | ORAL_TABLET | Freq: Once | ORAL | Status: DC

## 2015-04-18 MED ORDER — LIDOCAINE HCL (CARDIAC) 20 MG/ML IV SOLN
INTRAVENOUS | Status: DC | PRN
  Administered 2015-04-18: 50 mg via INTRAVENOUS

## 2015-04-18 MED ORDER — FENTANYL CITRATE (PF) 100 MCG/2ML IJ SOLN
INTRAMUSCULAR | Status: AC
  Filled 2015-04-18: qty 2

## 2015-04-18 MED ORDER — DEXAMETHASONE SODIUM PHOSPHATE 4 MG/ML IJ SOLN
INTRAMUSCULAR | Status: DC | PRN
  Administered 2015-04-18: 10 mg via INTRAVENOUS

## 2015-04-18 MED ORDER — ONDANSETRON HCL 4 MG/2ML IJ SOLN
INTRAMUSCULAR | Status: DC | PRN
  Administered 2015-04-18: 4 mg via INTRAVENOUS

## 2015-04-18 MED ORDER — SUCCINYLCHOLINE CHLORIDE 20 MG/ML IJ SOLN
INTRAMUSCULAR | Status: DC | PRN
  Administered 2015-04-18: 100 mg via INTRAVENOUS

## 2015-04-18 MED ORDER — PENTAZOCINE-NALOXONE HCL 50-0.5 MG PO TABS: 1.0000 | ORAL_TABLET | ORAL | Status: AC | PRN

## 2015-04-18 MED ORDER — PROMETHAZINE HCL 25 MG/ML IJ SOLN
6.2500 mg | Freq: Once | INTRAMUSCULAR | Status: AC
  Administered 2015-04-18: 6.25 mg via INTRAVENOUS

## 2015-04-18 MED ORDER — LACTATED RINGERS IV SOLN
INTRAVENOUS | Status: DC
  Administered 2015-04-18 (×3): via INTRAVENOUS
  Administered 2015-04-18: 10 mL/h via INTRAVENOUS

## 2015-04-18 MED ORDER — AMOXICILLIN 875 MG PO TABS: 875.0000 mg | ORAL_TABLET | Freq: Two times a day (BID) | ORAL | Status: AC

## 2015-04-18 MED ORDER — FENTANYL CITRATE (PF) 100 MCG/2ML IJ SOLN
25.0000 ug | INTRAMUSCULAR | Status: AC | PRN
  Administered 2015-04-18 (×6): 25 ug via INTRAVENOUS

## 2015-04-18 MED ORDER — ONDANSETRON HCL 4 MG/2ML IJ SOLN
INTRAMUSCULAR | Status: AC
  Filled 2015-04-18: qty 2

## 2015-04-18 MED ORDER — SODIUM CHLORIDE 0.9 % IJ SOLN
INTRAMUSCULAR | Status: AC
  Filled 2015-04-18: qty 10

## 2015-04-18 MED ORDER — BACITRACIN ZINC 500 UNIT/GM EX OINT
TOPICAL_OINTMENT | CUTANEOUS | Status: AC
  Filled 2015-04-18: qty 0.9

## 2015-04-18 MED ORDER — SCOPOLAMINE 1 MG/3DAYS TD PT72: 1.0000 | MEDICATED_PATCH | Freq: Once | TRANSDERMAL | Status: DC | PRN

## 2015-04-18 MED ORDER — OXYMETAZOLINE HCL 0.05 % NA SOLN
NASAL | Status: AC
  Filled 2015-04-18: qty 15

## 2015-04-18 MED ORDER — LIDOCAINE HCL (CARDIAC) 20 MG/ML IV SOLN
INTRAVENOUS | Status: AC
  Filled 2015-04-18: qty 5

## 2015-04-18 MED ORDER — LIDOCAINE-EPINEPHRINE 1 %-1:100000 IJ SOLN
INTRAMUSCULAR | Status: AC
  Filled 2015-04-18: qty 1

## 2015-04-18 MED ORDER — LIDOCAINE-EPINEPHRINE 1 %-1:100000 IJ SOLN
INTRAMUSCULAR | Status: DC | PRN
  Administered 2015-04-18: 2 mL via INTRADERMAL

## 2015-04-18 MED ORDER — MIDAZOLAM HCL 2 MG/2ML IJ SOLN
1.0000 mg | INTRAMUSCULAR | Status: DC | PRN
  Administered 2015-04-18: 2 mg via INTRAVENOUS

## 2015-04-18 MED ORDER — MIDAZOLAM HCL 2 MG/2ML IJ SOLN
INTRAMUSCULAR | Status: AC
  Filled 2015-04-18: qty 2

## 2015-04-18 MED ORDER — FENTANYL CITRATE (PF) 100 MCG/2ML IJ SOLN
50.0000 ug | INTRAMUSCULAR | Status: DC | PRN
  Administered 2015-04-18: 100 ug via INTRAVENOUS

## 2015-04-18 MED ORDER — PROPOFOL 10 MG/ML IV BOLUS
INTRAVENOUS | Status: DC | PRN
  Administered 2015-04-18: 150 mg via INTRAVENOUS

## 2015-04-18 MED ORDER — ARTIFICIAL TEARS OP OINT
TOPICAL_OINTMENT | OPHTHALMIC | Status: AC
  Filled 2015-04-18: qty 3.5

## 2015-04-18 MED ORDER — PROMETHAZINE HCL 25 MG/ML IJ SOLN
INTRAMUSCULAR | Status: AC
  Filled 2015-04-18: qty 1

## 2015-04-18 SURGICAL SUPPLY — 61 items
BLADE SURG 15 STRL LF DISP TIS (BLADE) ×1 IMPLANT
BLADE SURG 15 STRL SS (BLADE) ×3
CANISTER SUCT 1200ML W/VALVE (MISCELLANEOUS) ×3 IMPLANT
CLIP TI MEDIUM 6 (CLIP) IMPLANT
CLIP TI WIDE RED SMALL 6 (CLIP) IMPLANT
CORDS BIPOLAR (ELECTRODE) ×1 IMPLANT
COVER BACK TABLE 60X90IN (DRAPES) ×3 IMPLANT
COVER MAYO STAND STRL (DRAPES) ×3 IMPLANT
DECANTER SPIKE VIAL GLASS SM (MISCELLANEOUS) ×3 IMPLANT
DRAIN PENROSE 1/4X12 LTX STRL (WOUND CARE) IMPLANT
DRAIN TLS ROUND 10FR (DRAIN) IMPLANT
DRAPE U-SHAPE 76X120 STRL (DRAPES) ×3 IMPLANT
ELECT COATED BLADE 2.86 ST (ELECTRODE) ×3 IMPLANT
ELECT NDL BLADE 2-5/6 (NEEDLE) IMPLANT
ELECT NEEDLE BLADE 2-5/6 (NEEDLE) IMPLANT
ELECT REM PT RETURN 9FT ADLT (ELECTROSURGICAL) ×3
ELECTRODE REM PT RTRN 9FT ADLT (ELECTROSURGICAL) ×1 IMPLANT
FORCEPS BIPOLAR SPETZLER 8 1.0 (NEUROSURGERY SUPPLIES) ×3 IMPLANT
GAUZE SPONGE 4X4 16PLY XRAY LF (GAUZE/BANDAGES/DRESSINGS) IMPLANT
GLOVE BIO SURGEON STRL SZ 6.5 (GLOVE) ×1 IMPLANT
GLOVE BIO SURGEON STRL SZ7.5 (GLOVE) ×3 IMPLANT
GLOVE BIO SURGEONS STRL SZ 6.5 (GLOVE) ×1
GLOVE BIOGEL PI IND STRL 7.0 (GLOVE) IMPLANT
GLOVE BIOGEL PI INDICATOR 7.0 (GLOVE) ×2
GOWN STRL REUS W/ TWL LRG LVL3 (GOWN DISPOSABLE) ×2 IMPLANT
GOWN STRL REUS W/TWL LRG LVL3 (GOWN DISPOSABLE) ×6
HEMOSTAT SURGICEL .5X2 ABSORB (HEMOSTASIS) ×2 IMPLANT
LIQUID BAND (GAUZE/BANDAGES/DRESSINGS) IMPLANT
NDL HYPO 25X1 1.5 SAFETY (NEEDLE) ×1 IMPLANT
NEEDLE HYPO 25X1 1.5 SAFETY (NEEDLE) ×3 IMPLANT
NS IRRIG 1000ML POUR BTL (IV SOLUTION) ×3 IMPLANT
PACK BASIN DAY SURGERY FS (CUSTOM PROCEDURE TRAY) ×3 IMPLANT
PENCIL BUTTON HOLSTER BLD 10FT (ELECTRODE) ×3 IMPLANT
PIN SAFETY STERILE (MISCELLANEOUS) IMPLANT
SHEARS HARMONIC 9CM CVD (BLADE) ×3 IMPLANT
SLEEVE SCD COMPRESS KNEE MED (MISCELLANEOUS) ×2 IMPLANT
SPONGE GAUZE 2X2 8PLY STER LF (GAUZE/BANDAGES/DRESSINGS)
SPONGE GAUZE 2X2 8PLY STRL LF (GAUZE/BANDAGES/DRESSINGS) IMPLANT
SPONGE GAUZE 4X4 12PLY STER LF (GAUZE/BANDAGES/DRESSINGS) IMPLANT
SUT ETHILON 3 0 PS 1 (SUTURE) IMPLANT
SUT ETHILON 4 0 PS 2 18 (SUTURE) IMPLANT
SUT ETHILON 5 0 P 3 18 (SUTURE)
SUT NYLON ETHILON 5-0 P-3 1X18 (SUTURE) IMPLANT
SUT SILK 3 0 PS 1 (SUTURE) IMPLANT
SUT SILK 3 0 TIES 17X18 (SUTURE) ×3
SUT SILK 3-0 18XBRD TIE BLK (SUTURE) ×1 IMPLANT
SUT SILK 4 0 TIES 17X18 (SUTURE) ×1 IMPLANT
SUT VIC AB 3-0 FS2 27 (SUTURE) IMPLANT
SUT VIC AB 4-0 P-3 18XBRD (SUTURE) IMPLANT
SUT VIC AB 4-0 P3 18 (SUTURE)
SUT VIC AB 4-0 RB1 27 (SUTURE)
SUT VIC AB 4-0 RB1 27X BRD (SUTURE) IMPLANT
SUT VICRYL 4-0 PS2 18IN ABS (SUTURE) ×5 IMPLANT
SWAB COLLECTION DEVICE MRSA (MISCELLANEOUS) IMPLANT
SWAB CULTURE ESWAB REG 1ML (MISCELLANEOUS) IMPLANT
SYR BULB 3OZ (MISCELLANEOUS) ×3 IMPLANT
SYR CONTROL 10ML LL (SYRINGE) ×3 IMPLANT
TOWEL OR 17X24 6PK STRL BLUE (TOWEL DISPOSABLE) ×3 IMPLANT
TRAY DSU PREP LF (CUSTOM PROCEDURE TRAY) ×3 IMPLANT
TUBE CONNECTING 20'X1/4 (TUBING) ×1
TUBE CONNECTING 20X1/4 (TUBING) ×2 IMPLANT

## 2015-04-18 NOTE — Anesthesia Postprocedure Evaluation (Signed)
Anesthesia Post Note  Patient: [name]  Procedure(s) Performed: Procedure(s) (LRB): EXCISION THYROGLOSSAL DUCT CYST (N/A)  Patient location during evaluation: PACU Anesthesia Type: General Level of consciousness: awake and alert Pain management: pain level controlled Vital Signs Assessment: post-procedure vital signs reviewed and stable Respiratory status: spontaneous breathing, nonlabored ventilation and respiratory function stable Cardiovascular status: blood pressure returned to baseline and stable Postop Assessment: no signs of nausea or vomiting Anesthetic complications: no    Last Vitals:  Filed Vitals:   04/18/15 1030 04/18/15 1045  BP: 134/83 123/83  Pulse: 66 61  Temp:    Resp: 14 12    Last Pain:  Filed Vitals:   04/18/15 1100  PainSc: 2                  Jeannifer Drakeford,W. EDMOND

## 2015-04-18 NOTE — Transfer of Care (Signed)
Immediate Anesthesia Transfer of Care Note  Patient: [name]  Procedure(s) Performed: Procedure(s): EXCISION THYROGLOSSAL DUCT CYST (N/A)  Patient Location: PACU  Anesthesia Type:General  Level of Consciousness: awake  Airway & Oxygen Therapy: Patient Spontanous Breathing and Patient connected to face mask oxygen  Post-op Assessment: Report given to RN and Post -op Vital signs reviewed and stable  Post vital signs: Reviewed and stable  Last Vitals:  Filed Vitals:   04/18/15 0751  BP: 108/63  Pulse: 70  Temp: 36.6 C  Resp: 16    Complications: No apparent anesthesia complications

## 2015-04-18 NOTE — Anesthesia Procedure Notes (Signed)
Procedure Name: Intubation Date/Time: 04/18/2015 8:26 AM Performed by: Caren Macadam Pre-anesthesia Checklist: Patient identified, Emergency Drugs available, Suction available and Patient being monitored Patient Re-evaluated:Patient Re-evaluated prior to inductionOxygen Delivery Method: Circle System Utilized Preoxygenation: Pre-oxygenation with 100% oxygen Intubation Type: IV induction Ventilation: Mask ventilation without difficulty Laryngoscope Size: Miller and 2 Grade View: Grade I Tube type: Oral Tube size: 7.0 mm Number of attempts: 1 Airway Equipment and Method: Stylet and Oral airway Placement Confirmation: ETT inserted through vocal cords under direct vision,  positive ETCO2 and breath sounds checked- equal and bilateral Secured at: 22 cm Tube secured with: Tape Dental Injury: Teeth and Oropharynx as per pre-operative assessment

## 2015-04-18 NOTE — H&P (Signed)
Cc: Anterior neck mass  HPI: The patient is a 55 y/o female who presents today for evaluation of anterior neck mass. The patient is seen in consultation requested by Dr. Melvenia Beam. The patient states she woke up on December 23rd and noted pain and swelling along her anterior neck. She was treated by her PCP with a course of Amoxicillin with improvement in her neck pain and swelling. However, a few days later her symptoms returned. The patient was treated with a second course of antibiotic. She was seen by Dr. Emeline Darling who recommended surgical excision of the mass secondary to her multiple infections. The patient has no prior history of neck swelling or pain. No previous ENT surgery is noted.   The patient's review of systems (constitutional, eyes, ENT, cardiovascular, respiratory, GI, musculoskeletal, skin, neurologic, psychiatric, endocrine, hematologic, allergic) is noted in the ROS questionnaire.  It is reviewed with the patient.   Family health history: Diabetes, heart disease.   Major events: Right wrist surgery.   Ongoing medical problems: Hypertension, asthma, reflux, allergies.   Social history: The patient is married. She denies the use of tobacco or illegal drugs. She drinks alcohol 3-4 times a year.  Exam General: Communicates without difficulty, well nourished, no acute distress. Head: Normocephalic, no evidence injury, no tenderness, facial buttresses intact without stepoff. Eyes: PERRL, EOMI. No scleral icterus, conjunctivae clear. Neuro: CN II exam reveals vision grossly intact.  No nystagmus at any point of gaze. Ears: Auricles well formed without lesions.  Ear canals are intact without mass or lesion.  No erythema or edema is appreciated.  The TMs are intact without fluid. Nose: External evaluation reveals normal support and skin without lesions.  Dorsum is intact.  Anterior rhinoscopy reveals healthy pink mucosa over anterior aspect of inferior turbinates and intact septum.  No  purulence noted. Oral:  Oral cavity and oropharynx are intact, symmetric, without erythema or edema.  Mucosa is moist without lesions. Neck: Full range of motion without pain.  There is no significant lymphadenopathy.  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  Thyroid bed within normal limits to palpation.  Parotid glands and submandibular glands equal bilaterally without mass.  Trachea is midline.   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   Plan 1.   The physical exam findings are discussed with the patient at length. All questions and concerns are addressed.  2.   Treatment options include continued conservative observation versus surgical excision. In light of the patient's multiple infections, she will benefit from removal of the mass. The risks, benefits, alternatives, and details of the procedure are reviewed with the patient. Questions are invited and answered. 3.  The patient is interested in proceeding with the procedure.  We will schedule the procedure in accordance with the family schedule.

## 2015-04-18 NOTE — Anesthesia Preprocedure Evaluation (Signed)
Anesthesia Evaluation  Patient identified by MRN, date of birth, ID band Patient awake    Reviewed: Allergy & Precautions, H&P , NPO status , Patient's Chart, lab work & pertinent test results  Airway Mallampati: III  TM Distance: >3 FB Neck ROM: Full    Dental no notable dental hx. (+) Teeth Intact, Dental Advisory Given   Pulmonary asthma ,    Pulmonary exam normal breath sounds clear to auscultation       Cardiovascular hypertension, Pt. on medications  Rhythm:Regular Rate:Normal     Neuro/Psych negative neurological ROS  negative psych ROS   GI/Hepatic Neg liver ROS, GERD  Medicated and Controlled,  Endo/Other  negative endocrine ROS  Renal/GU Renal disease  negative genitourinary   Musculoskeletal  (+) Arthritis , Osteoarthritis,    Abdominal   Peds  Hematology negative hematology ROS (+)   Anesthesia Other Findings   Reproductive/Obstetrics negative OB ROS                             Anesthesia Physical Anesthesia Plan  ASA: II  Anesthesia Plan: General   Post-op Pain Management:    Induction: Intravenous  Airway Management Planned: Oral ETT  Additional Equipment:   Intra-op Plan:   Post-operative Plan: Extubation in OR  Informed Consent: I have reviewed the patients History and Physical, chart, labs and discussed the procedure including the risks, benefits and alternatives for the proposed anesthesia with the patient or authorized representative who has indicated his/her understanding and acceptance.   Dental advisory given  Plan Discussed with: CRNA  Anesthesia Plan Comments:         Anesthesia Quick Evaluation

## 2015-04-18 NOTE — Discharge Instructions (Addendum)
Avoid heavy lifting - more than 25 pounds. She may otherwise resume her activities and diet. The patient will follow up in my office in one week.  Post Anesthesia Home Care Instructions  Activity: Get plenty of rest for the remainder of the day. A responsible adult should stay with you for 24 hours following the procedure.  For the next 24 hours, DO NOT: -Drive a car -Advertising copywriter -Drink alcoholic beverages -Take any medication unless instructed by your physician -Make any legal decisions or sign important papers.  Meals: Start with liquid foods such as gelatin or soup. Progress to regular foods as tolerated. Avoid greasy, spicy, heavy foods. If nausea and/or vomiting occur, drink only clear liquids until the nausea and/or vomiting subsides. Call your physician if vomiting continues.  Special Instructions/Symptoms: Your throat may feel dry or sore from the anesthesia or the breathing tube placed in your throat during surgery. If this causes discomfort, gargle with warm salt water. The discomfort should disappear within 24 hours.  If you had a scopolamine patch placed behind your ear for the management of post- operative nausea and/or vomiting:  1. The medication in the patch is effective for 72 hours, after which it should be removed.  Wrap patch in a tissue and discard in the trash. Wash hands thoroughly with soap and water. 2. You may remove the patch earlier than 72 hours if you experience unpleasant side effects which may include dry mouth, dizziness or visual disturbances. 3. Avoid touching the patch. Wash your hands with soap and water after contact with the patch.

## 2015-04-18 NOTE — Op Note (Signed)
DATE OF PROCEDURE:  04/18/2015                              OPERATIVE REPORT  SURGEON:  Newman Pies, MD  PREOPERATIVE DIAGNOSES: Anterior midline neck mass  POSTOPERATIVE DIAGNOSES: Anterior midline neck mass  PROCEDURE PERFORMED:  Excision of thyroglossal duct cyst  ANESTHESIA:  General endotracheal tube anesthesia.  COMPLICATIONS:  None.  ESTIMATED BLOOD LOSS:  Minimal.  INDICATION FOR PROCEDURE:  NOVA SCHMUHL is a 55 y.o. female with a history of a recurrently infected anterior neck mass. She was previously treated with multiple antibiotics. The appearance and location of the anterior neck mass was suggestive of a thyroglossal duct cyst. Based on the above findings, the decision was made for patient to undergo surgical excision of the mass. The risks, benefits, alternatives, and details of the procedure were discussed with the mother.  Questions were invited and answered.  Informed consent was obtained.  DESCRIPTION:  The patient was taken to the operating room and placed supine on the operating table.  General endotracheal tube anesthesia was administered by the anesthesiologist.  The patient was positioned and prepped and draped in a standard fashion for  anterior neck mass surgery. 1% lidocaine with 1-100,000 epinephrine was infiltrated at the planned site of incision. A horizontal upper neck incision was made. The incision was carried down to the level of the platysma muscles. Subplatysmal flaps were elevated in a standard fashion. A neck mass was noted anterior to the thyroid cartilage. The entire mass was carefully excised from the surrounding soft tissue. Attention was then focused on the hyoid bone. The middle section of the hyoid bone was carefully dissected free from the attaching soft tissue. The midportion of the hyoid bone was removed. The specimens were sent to the pathology department for permanent histologic identification. The surgical site was copiously irrigated. Hemostasis was  achieved. The incision was closed in layers with 4-0 Vicryl and Dermabond.  The care of the patient was turned over to the anesthesiologist.  The patient was awakened from anesthesia without difficulty.  She was extubated and transferred to the recovery room in good condition.  OPERATIVE FINDINGS:  A 1 cm anterior neck mass.   SPECIMEN:  Anterior neck mass, likely a thyroglossal duct cyst. Segment of the hyoid bone.   FOLLOWUP CARE:  The patient will be discharged home once awake and alert.   The patient will follow up in my office in approximately 1 week.  Darletta Moll 04/18/2015 9:39 AM

## 2015-04-19 ENCOUNTER — Encounter (HOSPITAL_BASED_OUTPATIENT_CLINIC_OR_DEPARTMENT_OTHER): Payer: Self-pay | Admitting: Otolaryngology

## 2015-04-27 ENCOUNTER — Ambulatory Visit (INDEPENDENT_AMBULATORY_CARE_PROVIDER_SITE_OTHER): Payer: Managed Care, Other (non HMO) | Admitting: Otolaryngology

## 2016-12-18 ENCOUNTER — Other Ambulatory Visit: Payer: Self-pay | Admitting: Internal Medicine

## 2016-12-18 DIAGNOSIS — Z1231 Encounter for screening mammogram for malignant neoplasm of breast: Secondary | ICD-10-CM

## 2017-01-07 ENCOUNTER — Ambulatory Visit
Admission: RE | Admit: 2017-01-07 | Discharge: 2017-01-07 | Disposition: A | Discharge status: Home / Self Care | Payer: Managed Care, Other (non HMO) | Source: Ambulatory Visit | Attending: Internal Medicine | Admitting: Internal Medicine

## 2017-01-07 DIAGNOSIS — Z1231 Encounter for screening mammogram for malignant neoplasm of breast: Secondary | ICD-10-CM

## 2019-04-06 ENCOUNTER — Ambulatory Visit: Payer: Self-pay | Attending: Internal Medicine

## 2019-04-06 DIAGNOSIS — Z20822 Contact with and (suspected) exposure to covid-19: Secondary | ICD-10-CM

## 2019-04-07 LAB — NOVEL CORONAVIRUS, NAA: SARS-CoV-2, NAA: NOT DETECTED

## 2019-10-04 ENCOUNTER — Other Ambulatory Visit: Payer: Self-pay | Admitting: Internal Medicine

## 2019-10-04 DIAGNOSIS — Z1231 Encounter for screening mammogram for malignant neoplasm of breast: Secondary | ICD-10-CM

## 2019-10-18 ENCOUNTER — Ambulatory Visit
Admission: RE | Admit: 2019-10-18 | Discharge: 2019-10-18 | Disposition: A | Discharge status: Home / Self Care | Payer: Managed Care, Other (non HMO) | Source: Ambulatory Visit | Attending: Internal Medicine | Admitting: Internal Medicine

## 2019-10-18 ENCOUNTER — Other Ambulatory Visit: Payer: Self-pay

## 2019-10-18 DIAGNOSIS — Z1231 Encounter for screening mammogram for malignant neoplasm of breast: Secondary | ICD-10-CM

## 2020-02-03 ENCOUNTER — Other Ambulatory Visit (HOSPITAL_COMMUNITY): Payer: Self-pay | Admitting: Gastroenterology

## 2020-02-03 ENCOUNTER — Other Ambulatory Visit: Payer: Self-pay | Admitting: Gastroenterology

## 2020-02-03 DIAGNOSIS — R110 Nausea: Secondary | ICD-10-CM

## 2020-02-16 ENCOUNTER — Telehealth (HOSPITAL_COMMUNITY): Payer: Self-pay | Admitting: Emergency Medicine

## 2020-02-16 NOTE — Telephone Encounter (Signed)
Called pt and explained possible monoclonal antibody treatment. Pt received second vaccinate 08/27/19 Pfizer. Pt requested mAb tx for her father. I said we would screen him to see if he qualifies.

## 2020-02-22 ENCOUNTER — Ambulatory Visit (HOSPITAL_COMMUNITY): Payer: Managed Care, Other (non HMO)

## 2020-02-22 ENCOUNTER — Encounter (HOSPITAL_COMMUNITY): Payer: Self-pay

## 2020-03-21 ENCOUNTER — Ambulatory Visit (HOSPITAL_COMMUNITY): Payer: Managed Care, Other (non HMO)

## 2020-03-21 ENCOUNTER — Encounter (HOSPITAL_COMMUNITY): Payer: Managed Care, Other (non HMO)

## 2020-03-28 ENCOUNTER — Ambulatory Visit (HOSPITAL_COMMUNITY)
Admission: RE | Admit: 2020-03-28 | Discharge: 2020-03-28 | Disposition: A | Discharge status: Home / Self Care | Payer: Managed Care, Other (non HMO) | Source: Ambulatory Visit | Attending: Gastroenterology | Admitting: Gastroenterology

## 2020-03-28 ENCOUNTER — Other Ambulatory Visit: Payer: Self-pay

## 2020-03-28 DIAGNOSIS — R110 Nausea: Secondary | ICD-10-CM

## 2020-03-28 MED ORDER — TECHNETIUM TC 99M MEBROFENIN IV KIT
5.0000 | PACK | Freq: Once | INTRAVENOUS | Status: AC | PRN
Start: 2020-03-28 — End: 2020-03-28
  Administered 2020-03-28: 5 via INTRAVENOUS

## 2020-08-24 ENCOUNTER — Other Ambulatory Visit: Payer: Self-pay | Admitting: Internal Medicine

## 2020-08-24 ENCOUNTER — Ambulatory Visit
Admission: RE | Admit: 2020-08-24 | Discharge: 2020-08-24 | Disposition: A | Discharge status: Home / Self Care | Payer: Managed Care, Other (non HMO) | Source: Ambulatory Visit | Attending: Internal Medicine | Admitting: Internal Medicine

## 2020-08-24 DIAGNOSIS — M25571 Pain in right ankle and joints of right foot: Secondary | ICD-10-CM

## 2021-06-27 ENCOUNTER — Other Ambulatory Visit: Payer: Self-pay | Admitting: Internal Medicine

## 2021-06-27 DIAGNOSIS — Z1231 Encounter for screening mammogram for malignant neoplasm of breast: Secondary | ICD-10-CM

## 2021-07-11 ENCOUNTER — Ambulatory Visit: Payer: Managed Care, Other (non HMO)

## 2021-07-13 ENCOUNTER — Ambulatory Visit
Admission: RE | Admit: 2021-07-13 | Discharge: 2021-07-13 | Disposition: A | Discharge status: Home / Self Care | Payer: Managed Care, Other (non HMO) | Source: Ambulatory Visit | Attending: Internal Medicine | Admitting: Internal Medicine

## 2021-07-13 DIAGNOSIS — Z1231 Encounter for screening mammogram for malignant neoplasm of breast: Secondary | ICD-10-CM

## 2022-08-14 ENCOUNTER — Other Ambulatory Visit: Payer: Self-pay | Admitting: Internal Medicine

## 2022-08-14 DIAGNOSIS — Z1231 Encounter for screening mammogram for malignant neoplasm of breast: Secondary | ICD-10-CM

## 2022-08-28 ENCOUNTER — Ambulatory Visit
Admission: RE | Admit: 2022-08-28 | Discharge: 2022-08-28 | Disposition: A | Discharge status: Home / Self Care | Payer: Managed Care, Other (non HMO) | Source: Ambulatory Visit

## 2022-08-28 DIAGNOSIS — Z1231 Encounter for screening mammogram for malignant neoplasm of breast: Secondary | ICD-10-CM

## 2022-10-30 ENCOUNTER — Encounter: Payer: Self-pay | Admitting: Podiatry

## 2022-10-30 ENCOUNTER — Ambulatory Visit (INDEPENDENT_AMBULATORY_CARE_PROVIDER_SITE_OTHER): Payer: Managed Care, Other (non HMO)

## 2022-10-30 ENCOUNTER — Ambulatory Visit (INDEPENDENT_AMBULATORY_CARE_PROVIDER_SITE_OTHER): Payer: Managed Care, Other (non HMO) | Admitting: Podiatry

## 2022-10-30 DIAGNOSIS — M79672 Pain in left foot: Secondary | ICD-10-CM | POA: Diagnosis not present

## 2022-10-30 DIAGNOSIS — M79671 Pain in right foot: Secondary | ICD-10-CM | POA: Diagnosis not present

## 2022-10-30 DIAGNOSIS — M6740 Ganglion, unspecified site: Secondary | ICD-10-CM

## 2022-10-31 NOTE — Progress Notes (Signed)
Subjective:   Patient ID: [name], female   DOB: 62 y.o.   MRN: [id number]   HPI Patient presents stating she has a freely movable nodule on her great toe left foot and states she does have trouble getting numb.  States that it has been there for a short period of time but it has been bothersome.  Patient does not smoke and does like to be active  Review of Systems  All other systems reviewed and are negative.       Objective:  Physical Exam Vitals and nursing note reviewed.  Constitutional:      Appearance: She is well-developed.  Pulmonary:     Effort: Pulmonary effort is normal.  Musculoskeletal:        General: Normal range of motion.  Skin:    General: Skin is warm.  Neurological:     Mental Status: She is alert.     Neurovascular status was found to be intact muscle strength adequate a small nodule was located on the medial side of the left first metatarsal head freely movable within subcutaneous tissue with no proximal edema erythema or drainage noted     Assessment:  Possibility for ganglionic cyst versus possibility for congealed hematoma or other mass     Plan:  H&P reviewed I did do proximal nerve block and it was difficult for it to take effect.  I did aspirate the area was able to get out a small amount of fluid but I did not find it to be gelatinous and I was not sure about is able to get it all out with pain still present but seems better and we will begin warm compresses at home and if symptoms persist may need to consider other treatment plan  X-rays were negative for calcification or other bony process associated with condition

## 2023-09-23 ENCOUNTER — Other Ambulatory Visit: Payer: Self-pay | Admitting: Internal Medicine

## 2023-09-23 DIAGNOSIS — Z1231 Encounter for screening mammogram for malignant neoplasm of breast: Secondary | ICD-10-CM

## 2023-10-08 ENCOUNTER — Ambulatory Visit
Admission: RE | Admit: 2023-10-08 | Discharge: 2023-10-08 | Disposition: A | Discharge status: Home / Self Care | Source: Ambulatory Visit | Attending: Internal Medicine | Admitting: Internal Medicine

## 2023-10-08 DIAGNOSIS — Z1231 Encounter for screening mammogram for malignant neoplasm of breast: Secondary | ICD-10-CM

## 2023-10-13 ENCOUNTER — Other Ambulatory Visit: Payer: Self-pay | Admitting: Internal Medicine

## 2023-10-13 DIAGNOSIS — R928 Other abnormal and inconclusive findings on diagnostic imaging of breast: Secondary | ICD-10-CM

## 2023-10-17 ENCOUNTER — Ambulatory Visit

## 2023-10-17 ENCOUNTER — Ambulatory Visit
Admission: RE | Admit: 2023-10-17 | Discharge: 2023-10-17 | Disposition: A | Discharge status: Home / Self Care | Source: Ambulatory Visit | Attending: Internal Medicine | Admitting: Internal Medicine

## 2023-10-17 DIAGNOSIS — R928 Other abnormal and inconclusive findings on diagnostic imaging of breast: Secondary | ICD-10-CM
